# Patient Record
Sex: Female | Born: 1955 | Race: White | Hispanic: No | Marital: Married | Smoking: Never smoker
Health system: Southern US, Community
[De-identification: ages and names within clinical notes are randomized; demographics above are authoritative.]

## PROBLEM LIST (undated history)

## (undated) DIAGNOSIS — M549 Dorsalgia, unspecified: Secondary | ICD-10-CM

## (undated) DIAGNOSIS — E7800 Pure hypercholesterolemia, unspecified: Secondary | ICD-10-CM

## (undated) DIAGNOSIS — T7840XA Allergy, unspecified, initial encounter: Secondary | ICD-10-CM

## (undated) DIAGNOSIS — K219 Gastro-esophageal reflux disease without esophagitis: Secondary | ICD-10-CM

## (undated) HISTORY — PX: MOHS SURGERY: SUR867

## (undated) HISTORY — DX: Gastro-esophageal reflux disease without esophagitis: K21.9

## (undated) HISTORY — DX: Dorsalgia, unspecified: M54.9

## (undated) HISTORY — DX: Pure hypercholesterolemia, unspecified: E78.00

## (undated) HISTORY — DX: Allergy, unspecified, initial encounter: T78.40XA

## (undated) HISTORY — PX: FRACTURE SURGERY: SHX138

## (undated) HISTORY — PX: WISDOM TOOTH EXTRACTION: SHX21

---

## 1999-09-26 ENCOUNTER — Encounter: Payer: Self-pay | Admitting: Family Medicine

## 1999-09-26 ENCOUNTER — Encounter: Admission: RE | Admit: 1999-09-26 | Discharge: 1999-09-26 | Payer: Self-pay | Admitting: Family Medicine

## 1999-11-29 ENCOUNTER — Encounter: Payer: Self-pay | Admitting: Family Medicine

## 1999-11-29 ENCOUNTER — Encounter: Admission: RE | Admit: 1999-11-29 | Discharge: 1999-11-29 | Payer: Self-pay | Admitting: Family Medicine

## 2000-11-29 ENCOUNTER — Encounter: Payer: Self-pay | Admitting: Family Medicine

## 2000-11-29 ENCOUNTER — Encounter: Admission: RE | Admit: 2000-11-29 | Discharge: 2000-11-29 | Payer: Self-pay | Admitting: Family Medicine

## 2002-02-17 ENCOUNTER — Encounter: Payer: Self-pay | Admitting: Family Medicine

## 2002-02-17 ENCOUNTER — Encounter: Admission: RE | Admit: 2002-02-17 | Discharge: 2002-02-17 | Payer: Self-pay | Admitting: Family Medicine

## 2002-03-07 DIAGNOSIS — D126 Benign neoplasm of colon, unspecified: Secondary | ICD-10-CM

## 2002-03-07 HISTORY — DX: Benign neoplasm of colon, unspecified: D12.6

## 2002-11-07 ENCOUNTER — Emergency Department (HOSPITAL_COMMUNITY): Admission: EM | Admit: 2002-11-07 | Discharge: 2002-11-07 | Payer: Self-pay

## 2003-08-16 ENCOUNTER — Other Ambulatory Visit: Admission: RE | Admit: 2003-08-16 | Discharge: 2003-08-16 | Payer: Self-pay | Admitting: Family Medicine

## 2004-05-07 HISTORY — PX: VAGINAL HYSTERECTOMY: SUR661

## 2004-09-12 ENCOUNTER — Other Ambulatory Visit: Admission: RE | Admit: 2004-09-12 | Discharge: 2004-09-12 | Payer: Self-pay | Admitting: Obstetrics and Gynecology

## 2005-04-16 ENCOUNTER — Ambulatory Visit (HOSPITAL_COMMUNITY): Admission: RE | Admit: 2005-04-16 | Discharge: 2005-04-17 | Payer: Self-pay | Admitting: Obstetrics and Gynecology

## 2005-04-16 ENCOUNTER — Encounter (INDEPENDENT_AMBULATORY_CARE_PROVIDER_SITE_OTHER): Payer: Self-pay | Admitting: Specialist

## 2009-11-15 ENCOUNTER — Encounter (INDEPENDENT_AMBULATORY_CARE_PROVIDER_SITE_OTHER): Payer: Self-pay | Admitting: *Deleted

## 2009-11-18 ENCOUNTER — Encounter: Admission: RE | Admit: 2009-11-18 | Discharge: 2009-11-18 | Payer: Self-pay | Admitting: Family Medicine

## 2010-01-06 ENCOUNTER — Encounter (INDEPENDENT_AMBULATORY_CARE_PROVIDER_SITE_OTHER): Payer: Self-pay | Admitting: *Deleted

## 2010-01-10 ENCOUNTER — Ambulatory Visit: Payer: Self-pay | Admitting: Gastroenterology

## 2010-01-19 ENCOUNTER — Ambulatory Visit: Payer: Self-pay | Admitting: Gastroenterology

## 2010-01-20 ENCOUNTER — Encounter: Payer: Self-pay | Admitting: Gastroenterology

## 2010-02-10 ENCOUNTER — Telehealth (INDEPENDENT_AMBULATORY_CARE_PROVIDER_SITE_OTHER): Payer: Self-pay | Admitting: *Deleted

## 2010-06-08 NOTE — Procedures (Signed)
Summary: Colonoscopy  Patient: [name] Note: All result statuses are Final unless otherwise noted.  Tests: (1) Colonoscopy (COL)   COL Colonoscopy           DONE      [facility]     [address]     [place], [place]  [address]           COLONOSCOPY PROCEDURE REPORT     PATIENT:  [name], [name]  MR#:  [id number]     BIRTHDATE:  [birth year], 53 yrs. old  GENDER:  female     ENDOSCOPIST:  Judie Petit T. Russella Dar, MD, [facility]           PROCEDURE DATE:  01/19/2010     PROCEDURE:  Colonoscopy with snare polypectomy     ASA CLASS:  Class II     INDICATIONS:  1) surveillance and high-risk screening 2) family     history of colon cancer 3) follow-up of adenomatous polyp,     03/2002. Mother with colon cancer at 17.     MEDICATIONS:   Fentanyl 100 mcg IV, Versed 10 mg IV     DESCRIPTION OF PROCEDURE:   After the risks benefits and     alternatives of the procedure were thoroughly explained, informed     consent was obtained.  Digital rectal exam was performed and     revealed no abnormalities.   The LB [id number]-[id number] endoscope     was introduced through the anus and advanced to the cecum, which     was identified by both the appendix and ileocecal valve, without     limitations.  The quality of the prep was excellent, using     MoviPrep.  The instrument was then slowly withdrawn as the colon     was fully examined.     <<PROCEDUREIMAGES>>     FINDINGS:  A sessile polyp was found in the mid transverse colon.     It was 5 mm in size. Polyp was snared without cautery. Retrieval     was successful.  A normal appearing cecum, ileocecal valve, and     appendiceal orifice were identified. The ascending, hepatic     flexure, splenic flexure, descending, sigmoid colon, and rectum     appeared unremarkable. Retroflexed views in the rectum revealed no     abnormalities. The time to cecum =  3.5  minutes. The scope was     then withdrawn (time =  8.25  min) from the patient and the  procedure completed.           COMPLICATIONS:  None           ENDOSCOPIC IMPRESSION:     1) 5 mm sessile polyp in the mid transverse colon           RECOMMENDATIONS:     1) Await pathology results     2) Repeat Colonoscopy in 5 years.           Venita Lick. Russella Dar, MD, [facility]           CC: Merri Brunette, MD           n.     Rosalie DoctorVenita Lick. Rayshell Goecke at 01/19/2010 10:17 AM           Dhaliwal, Rinaldo Cloud, [id number]  Note: An exclamation mark (!) indicates a result that was not dispersed into the flowsheet. Document Creation Date: 01/19/2010 10:17 AM _______________________________________________________________________  (1) Order result status: Final  Collection or observation date-time: 01/19/2010 10:12 Requested date-time:  Receipt date-time:  Reported date-time:  Referring Physician:   Ordering Physician: Claudette Head [phone]) Specimen Source:  Source: Launa Grill Order Number: [id number] Lab site:   Appended Document: Colonoscopy     Procedures Next Due Date:    Colonoscopy: 01/2015

## 2010-06-08 NOTE — Progress Notes (Signed)
  Phone Note Other Incoming   Request: Send information Summary of Call: Request for records received from [facility]. Request forwarded to [employer].

## 2010-06-08 NOTE — Letter (Signed)
Summary: Previsit letter  [facility]  [address] [place], [place] [address]   Phone: [phone]  Fax: [phone]       11/15/2009 MRN: [id number]  [facility] [place] [address] [place], [place]  [address]  Dear [name],  Welcome to the Gastroenterology Division at [facility].    You are scheduled to see a nurse for your pre-procedure visit on 01-10-10 at 2:00p.m. on the 3rd floor at [facility], [address]  We ask that you try to arrive at our office 15 minutes prior to your appointment time to allow for check-in.  Your nurse visit will consist of discussing your medical and surgical history, your immediate family medical history, and your medications.    Please bring a complete list of all your medications or, if you prefer, bring the medication bottles and we will list them.  We will need to be aware of both prescribed and over the counter drugs.  We will need to know exact dosage information as well.  If you are on blood thinners (Coumadin, Plavix, Aggrenox, Ticlid, etc.) please call our office today/prior to your appointment, as we need to consult with your physician about holding your medication.   Please be prepared to read and sign documents such as consent forms, a financial agreement, and acknowledgement forms.  If necessary, and with your consent, a friend or relative is welcome to sit-in on the nurse visit with you.  Please bring your insurance card so that we may make a copy of it.  If your insurance requires a referral to see a specialist, please bring your referral form from your primary care physician.  No co-pay is required for this nurse visit.     If you cannot keep your appointment, please call [phone] to cancel or reschedule prior to your appointment date.  This allows [place] the opportunity to schedule an appointment for another patient in need of care.    Thank you for choosing [facility] for your medical  needs.  We appreciate the opportunity to care for you.  Please visit [place] at our website  to learn more about our practice.                     Sincerely.                                                                                                                   The Gastroenterology Division

## 2010-06-08 NOTE — Letter (Signed)
Summary: Patient Notice-Hyperplastic Polyps  [facility]  [address] [place], [place] [address]   Phone: [phone]  Fax: [phone]        January 20, 2010 MRN: [id number]    [name] [address] [place], [place]  [address]    Dear [name],  I am pleased to inform you that the colon polyp(s) removed during your recent colonoscopy was (were) found to be hyperplastic. These types of polyps are NOT pre-cancerous.  It is therefore my recommendation that you have a repeat colonoscopy examination in 5 years for routine colorectal cancer screening.  Should you develop new or worsening symptoms of abdominal pain, bowel habit changes or bleeding from the rectum or bowels, please schedule an evaluation with either your primary care physician or with me.  Continue treatment plan as outlined the day of your exam.  Please call us if you are having persistent problems or have questions about your condition that have not been fully answered at this time.  Sincerely,  Meryl Dare MD [facility]  This letter has been electronically signed by your physician.  Appended Document: Patient Notice-Hyperplastic Polyps letter mailed

## 2010-06-08 NOTE — Miscellaneous (Signed)
Summary: [facility] Previsit/prep  Clinical Lists Changes  Medications: Added new medication of MOVIPREP 100 GM  SOLR (PEG-KCL-NACL-NASULF-NA ASC-C) As per prep instructions. - Signed Rx of MOVIPREP 100 GM  SOLR (PEG-KCL-NACL-NASULF-NA ASC-C) As per prep instructions.;  #1 x 0;  Signed;  Entered by: Wyona Almas RN;  Authorized by: Meryl Dare MD [facility];  Method used: Electronically to [id number]*, [address] [place], [place], [place]  [address], Ph: [phone], Fax: [phone] Observations: Added new observation of NKA: T (01/10/2010 13:59)    Prescriptions: MOVIPREP 100 GM  SOLR (PEG-KCL-NACL-NASULF-NA ASC-C) As per prep instructions.  #1 x 0   Entered by:   Wyona Almas RN   Authorized by:   Meryl Dare MD [facility]   Signed by:   Wyona Almas RN on 01/10/2010   Method used:   Electronically to        [facility] [phone]* (retail)       [address]       [place], [place]  [address]       Ph: [phone]       Fax: [phone]   RxID:   [id number]

## 2010-06-08 NOTE — Letter (Signed)
Summary: [facility] Instructions  [facility]  [address] [place], [place] [address]   Phone: [phone]  Fax: [phone]       [name]    February 01, 1956    MRN: [id number]        Procedure Day /Date: Thursday  01-19-10     Arrival Time: 8:30 a.m.     Procedure Time: 9:30 a.m.     Location of Procedure:                    _x _  [facility] (4th Floor)                       PREPARATION FOR COLONOSCOPY WITH MOVIPREP   Starting 5 days prior to your procedure  01-14-10 do not eat nuts, seeds, popcorn, corn, beans, peas,  salads, or any raw vegetables.  Do not take any fiber supplements (e.g. Metamucil, Citrucel, and Benefiber).  THE DAY BEFORE YOUR PROCEDURE         DATE:  01-18-10  DAY: Wednesday  1.  Drink clear liquids the entire day-NO SOLID FOOD  2.  Do not drink anything colored red or purple.  Avoid juices with pulp.  No orange juice.  3.  Drink at least 64 oz. (8 glasses) of fluid/clear liquids during the day to prevent dehydration and help the prep work efficiently.  CLEAR LIQUIDS INCLUDE: Water Jello Ice Popsicles Tea (sugar ok, no milk/cream) Powdered fruit flavored drinks Coffee (sugar ok, no milk/cream) Gatorade Juice: apple, white grape, white cranberry  Lemonade Clear bullion, consomm, broth Carbonated beverages (any kind) Strained chicken noodle soup Hard Candy                             4.  In the morning, mix first dose of MoviPrep solution:    Empty 1 Pouch A and 1 Pouch B into the disposable container    Add lukewarm drinking water to the top line of the container. Mix to dissolve    Refrigerate (mixed solution should be used within 24 hrs)  5.  Begin drinking the prep at 5:00 p.m. The MoviPrep container is divided by 4 marks.   Every 15 minutes drink the solution down to the next mark (approximately 8 oz) until the full liter is complete.   6.  Follow completed prep with 16 oz of clear liquid of your choice  (Nothing red or purple).  Continue to drink clear liquids until bedtime.  7.  Before going to bed, mix second dose of MoviPrep solution:    Empty 1 Pouch A and 1 Pouch B into the disposable container    Add lukewarm drinking water to the top line of the container. Mix to dissolve    Refrigerate  THE DAY OF YOUR PROCEDURE      DATE: 01-19-10  DAY: Thursday  Beginning at  4:30 a.m. (5 hours before procedure):         1. Every 15 minutes, drink the solution down to the next mark (approx 8 oz) until the full liter is complete.  2. Follow completed prep with 16 oz. of clear liquid of your choice.    3. You may drink clear liquids until  7:30 a.m. (2 HOURS BEFORE PROCEDURE).   MEDICATION INSTRUCTIONS  Unless otherwise instructed, you should take regular prescription medications with a small sip of water   as early  as possible the morning of your procedure.        OTHER INSTRUCTIONS  You will need a responsible adult at least 56 years of age to accompany you and drive you home.   This person must remain in the waiting room during your procedure.  Wear loose fitting clothing that is easily removed.  Leave jewelry and other valuables at home.  However, you may wish to bring a book to read or  an iPod/MP3 player to listen to music as you wait for your procedure to start.  Remove all body piercing jewelry and leave at home.  Total time from sign-in until discharge is approximately 2-3 hours.  You should go home directly after your procedure and rest.  You can resume normal activities the  day after your procedure.  The day of your procedure you should not:   Drive   Make legal decisions   Operate machinery   Drink alcohol   Return to work  You will receive specific instructions about eating, activities and medications before you leave.    The above instructions have been reviewed and explained to me by   Wyona Almas RN  January 10, 2010 2:24 PM     I fully  understand and can verbalize these instructions _____________________________ Date _________

## 2010-09-22 NOTE — Op Note (Signed)
[name], [name]              ACCOUNT NO.:  [id number]   MEDICAL RECORD NO.:  [id number]          PATIENT TYPE:  INP   LOCATION:  0008                         FACILITY:  [facility]   PHYSICIAN:  Cynthia P. Romine, M.D.DATE OF BIRTH:  1955-10-19   DATE OF PROCEDURE:  04/16/2005  DATE OF DISCHARGE:                                 OPERATIVE REPORT   PREOPERATIVE DIAGNOSES:  Menorrhagia, submucous fibroid, urinary  incontinence.   POSTOPERATIVE DIAGNOSES:  Menorrhagia, submucous fibroid, urinary  incontinence. Path pending.   PROCEDURE:  Total vaginal hysterectomy, pubovaginal sling by Dr. Logan Bores which  will be dictated separately.   SURGEON:  Cynthia P. Romine, M.D.   ASSISTANT:  Hal Morales, M.D.   ANESTHESIA:  General endotracheal.   ESTIMATED BLOOD LOSS:  100 mL.   COMPLICATIONS:  None.   DESCRIPTION OF PROCEDURE:  The patient was taken to the operating room and  was placed in the dorsal lithotomy position while she was still awake  because of her history of having had a lumbar laminectomy and the surgeons  desired to be certain that the patient was comfortable with her feet up in  stirrups before she was put under anesthesia. She was then put under general  endotracheal anesthesia and prepped and draped in the usual fashion. A  posterior weighted and anterior Deaver retractor were placed. The cervix was  grasped with a Christella Hartigan tenaculum. The mucosa over the cervix was infiltrated  with 0.5% Marcaine with epinephrine. The mucosa was then incised with a  knife and pushed back bluntly with a sponge. Attention was next turned  posteriorly. A posterior colpotomy incision was made and the Bonnano  retractor was placed. The LigaSure device was used to clamp and cauterize  the uterosacral ligaments on each side. It was then cut, the hysterectomy  continued at the cardinal ligament clamping, cauterizing and cutting in  sequence. The uterine arteries were taken with the  LigaSure as well. The  hysterectomy then continued up the broad ligament clamping, cauterizing and  cutting until such a time that the fundus could then be delivered through  the posterior cul-de-sac. A regular Heaney clamp was placed across the  pedicle containing the utero-ovarian round and tube on each side and the  specimen was removed with Mayo scissors and sent to pathology. The pedicles  were doubly tied first with a free tie of #0 Vicryl and then with a Heaney  suture. On each side, there was a small area between this pedicle and the  previous pedicle that was noted to be bleeding slightly but it was  controlled with a single suture of #0 Vicryl. All the pedicles were  inspected and were felt to be hemostatic. The ovaries and tubes appeared  normal. The pedicles were then cut free and the ovaries were allowed to  retract back up into the abdomen. The vaginal cuff was then run and locked  with #0 chromic from 1 o'clock to 11 o'clock for hemostasis. Anesthesia was  used to bring the uterosacral ligaments together in the midline by going in  from the vagina into the  peritoneum posteriorly grasping the left  uterosacral ligament skimming across the peritoneum, grasping the right  uterosacral ligament and coming out again from the peritoneum out through  the vagina in the midline. The suture was tied and it did seem to bring the  uterosacral ligaments together nicely. The vaginal cuff was  then closed with three interrupted figure-of-eight sutures of #0 Vicryl. The  incision was irrigated, was hemostatic and the procedure was then turned  over to Dr. Eudelia Bunch who was in the room to do the urologic procedure. This  will be dictated separately.           ______________________________  Edwena Felty. Romine, M.D.     CPR/MEDQ  D:  04/16/2005  T:  04/17/2005  Job:  [id number]   cc:   Hal Morales, M.D.  Fax: [phone]

## 2010-09-22 NOTE — Op Note (Signed)
NAMEWILEY, FLICKER              ACCOUNT NO.:  [id number]   MEDICAL RECORD NO.:  [id number]          PATIENT TYPE:  INP   LOCATION:  0008                         FACILITY[facility]   PHYSICIAN:  Jamison Neighbor, M.D.  DATE OF BIRTH:  1956/03/03   DATE OF PROCEDURE:  04/16/2005  DATE OF DISCHARGE:                                 OPERATIVE REPORT   PREOPERATIVE DIAGNOSIS:  Stress urinary incontinence.   POSTOPERATIVE DIAGNOSIS:  Stress urinary incontinence.   PROCEDURE:  Cystoscopy and transobturator sling.   SURGEON:  Jamison Neighbor, M.D.   ANESTHESIA:  General.   COMPLICATIONS:  None.   DRAINS:  A 16 French Foley catheter.   BRIEF HISTORY:  This 55 year old female was scheduled to undergo  hysterectomy because of menometrorrhagia and a systematic cystocele.  She is  known to have a submucosus myoma.  She will also undergo a pubovaginal  sling.  She is undergoing appropriate evaluation, including a Marshall test  and urodynamics, which demonstrated the presence of stress incontinence but  no evidence of any urgency problems.  She will undergo placement of the  sling after the completion of her hysterectomy.  She understands the risks  and benefits of the procedure and gave full informed consent.   PREOPERATIVE DIAGNOSIS:  Interstitial cystitis.   POSTOPERATIVE DIAGNOSIS:  Interstitial cystitis.   PROCEDURE:  Cystoscopy, urethral calibration, hydrodistention of the  bladder, Marcaine and Pyridium instillation, Marcaine and Kenalog injection.   SURGEON:  Jamison Neighbor, M.D.   ANESTHESIA:  General.   COMPLICATIONS:  None.   DRAINS:  None.   PROCEDURE:  After the successful induction of general anesthesia, the  patient is placed in a dorsal lithotomy position, prepped with Betadine, and  draped in the usual sterile fashion.  Patient underwent a successful  hysterectomy by Dr. Tresa Res, who left the vaginal cuff  completely closed.  Patient was taken out of the high  lithotomy position and placed in a low  lithotomy position.  The anterior vaginal mucosa was infiltrated with local  anesthetic, and an incision was made directly over the mid urethra.  Flaps  were raised bilaterally, extending back to but not through the endopelvic  fascia .  A stab incision was made in the groin crease at the approximate  level of the clitoris, and the curved needle from the [facility]  sling kit was passed from the groin incision to the vaginal incision  bilaterally.  On the right-hand side, the mucosa at the sulcus of the vagina  was caught by the needle, which was then repositioned, and that small  opening was then closed with a figure-of-eight suture of 2-0 Vicryl.  The  cystoscope was inserted.  The bladder was carefully inspected with a 70  degree lenses.  There was no evidence of injury to the bladder.  The bladder  itself was unremarkable in its appearance with no tumors or stones seen.  The sling was then placed, and the needles were withdrawn, pushing this up  to the two groin incisions.  The sling tension was then set by cutting away  the blue tab and then pulling away the protective sheath by keeping a small  instrument between the urethra and the sling.  The tension was ascertained  to be correct by careful inspection, which shows good coaptation of the  urethra and by Crede maneuver, which did show normal flow of urine but no  flow of urine until Crede maneuver was performed.  The area was carefully  irrigated with antibiotic solution.  It did appear there was a small loop  between the urethra and the sling, as planned.  The incision was closed with  a running suture of 2-0 Vicryl.  The patient had a vaginal pack placed.  She  tolerated the procedure well and was taken to the recovery room in good  condition.           ______________________________  Jamison Neighbor, M.D.  Electronically Signed     RJE/MEDQ  D:  04/16/2005  T:  04/16/2005   Job:  [id number]

## 2012-03-26 ENCOUNTER — Other Ambulatory Visit: Payer: Self-pay | Admitting: Internal Medicine

## 2012-03-26 DIAGNOSIS — Z1231 Encounter for screening mammogram for malignant neoplasm of breast: Secondary | ICD-10-CM

## 2012-05-14 ENCOUNTER — Ambulatory Visit: Payer: Self-pay

## 2012-06-10 ENCOUNTER — Ambulatory Visit
Admission: RE | Admit: 2012-06-10 | Discharge: 2012-06-10 | Disposition: A | Discharge status: Home / Self Care | Payer: BC Managed Care – PPO | Source: Ambulatory Visit | Attending: Internal Medicine | Admitting: Internal Medicine

## 2012-06-10 DIAGNOSIS — Z1231 Encounter for screening mammogram for malignant neoplasm of breast: Secondary | ICD-10-CM

## 2013-02-19 ENCOUNTER — Other Ambulatory Visit: Payer: Self-pay | Admitting: Dermatology

## 2013-09-10 ENCOUNTER — Ambulatory Visit: Payer: Self-pay | Admitting: Otolaryngology

## 2013-09-11 LAB — PATHOLOGY REPORT

## 2014-05-07 HISTORY — PX: COLONOSCOPY: SHX174

## 2014-08-28 NOTE — Op Note (Signed)
PATIENT NAME:  [name], [name] MR#:  [id number] DATE OF BIRTH:  1955/10/16  DATE OF PROCEDURE:  09/10/2013  PREOPERATIVE DIAGNOSIS: Left forehead lesion (periosteal fibroma versus osteoma).   POSTOPERATIVE DIAGNOSIS:  Periosteal fibrolipoma.  SURGEON: Nadeen Landau, M.D.   DESCRIPTION OF PROCEDURE: The patient was placed in the supine position on the operating table, and after LMA anesthesia had been induced, the patient's forehead was marked, locally anesthetized, prepped and draped in the usual fashion. A #15 C blade was used to make an incision through the skin. The skin was reflected in a subfrontalis plane. The lesion was skeletonized. Sharp dissection was carried out to remove the lesion from the frontal skull. The lesion was completely freed and removed sharply. The wound was then copiously irrigated and closed with 5-0 Vicryl and 7-0 nylon. Iced gauze was placed after bacitracin. The patient was then returned to anesthesia, allowed to emerge from anesthesia in the operating room, and taken to the recovery room in stable condition. There were no complications. Estimated blood loss 5 mL.    ____________________________ J. Nadeen Landau, MD jmc:dmm D: 09/10/2013 08:09:43 ET T: 09/10/2013 12:29:12 ET JOB#: [id number]  cc: Janalee Dane, MD, <Dictator> Nicholos Johns MD ELECTRONICALLY SIGNED 10/04/2013 17:24

## 2014-09-13 ENCOUNTER — Encounter: Payer: Self-pay | Admitting: Gastroenterology

## 2014-12-10 ENCOUNTER — Encounter: Payer: Self-pay | Admitting: Gastroenterology

## 2014-12-27 ENCOUNTER — Encounter: Payer: Self-pay | Admitting: Gastroenterology

## 2015-01-11 ENCOUNTER — Other Ambulatory Visit: Payer: Self-pay

## 2015-01-11 DIAGNOSIS — Z1231 Encounter for screening mammogram for malignant neoplasm of breast: Secondary | ICD-10-CM

## 2015-02-03 ENCOUNTER — Ambulatory Visit
Admission: RE | Admit: 2015-02-03 | Discharge: 2015-02-03 | Disposition: A | Discharge status: Home / Self Care | Payer: BLUE CROSS/BLUE SHIELD | Source: Ambulatory Visit

## 2015-02-03 DIAGNOSIS — Z1231 Encounter for screening mammogram for malignant neoplasm of breast: Secondary | ICD-10-CM

## 2015-02-22 ENCOUNTER — Ambulatory Visit (AMBULATORY_SURGERY_CENTER): Payer: Self-pay

## 2015-02-22 VITALS — Ht 67.0 in | Wt 214.6 lb

## 2015-02-22 DIAGNOSIS — Z860100 Personal history of colon polyps, unspecified: Secondary | ICD-10-CM

## 2015-02-22 MED ORDER — SUPREP BOWEL PREP KIT 17.5-3.13-1.6 GM/177ML PO SOLN: 1.0000 | Freq: Once | ORAL | Status: DC

## 2015-02-22 NOTE — Progress Notes (Signed)
No allergies to eggs or soy No home oxygen No diet/weight loss meds No past problems with anesthesia  Refused emmi; has had before

## 2015-03-08 ENCOUNTER — Encounter: Payer: Self-pay | Admitting: Gastroenterology

## 2015-03-08 ENCOUNTER — Ambulatory Visit (AMBULATORY_SURGERY_CENTER): Payer: BLUE CROSS/BLUE SHIELD | Admitting: Gastroenterology

## 2015-03-08 VITALS — BP 127/68 | HR 58 | Temp 96.3°F | Resp 16 | Ht 67.0 in | Wt 214.0 lb

## 2015-03-08 DIAGNOSIS — D123 Benign neoplasm of transverse colon: Secondary | ICD-10-CM

## 2015-03-08 DIAGNOSIS — D122 Benign neoplasm of ascending colon: Secondary | ICD-10-CM

## 2015-03-08 DIAGNOSIS — Z86010 Personal history of colonic polyps: Secondary | ICD-10-CM

## 2015-03-08 DIAGNOSIS — Z800 Family history of malignant neoplasm of digestive organs: Secondary | ICD-10-CM

## 2015-03-08 MED ORDER — SODIUM CHLORIDE 0.9 % IV SOLN: 500.0000 mL | INTRAVENOUS | Status: DC

## 2015-03-08 NOTE — Progress Notes (Signed)
Called to room to assist during endoscopic procedure.  Patient ID and intended procedure confirmed with present staff. Received instructions for my participation in the procedure from the performing physician.  

## 2015-03-08 NOTE — Op Note (Signed)
[name], [address]   COLONOSCOPY PROCEDURE REPORT  PATIENT: Jane, Cook  MR#: [id number] BIRTHDATE: 1956/05/05 , 68  yrs. old GENDER: female ENDOSCOPIST: Ladene Artist, MD, [facility] PROCEDURE DATE:  03/08/2015 PROCEDURE:   Colonoscopy, surveillance and Colonoscopy with snare polypectomy First Screening Colonoscopy - Avg.  risk and is 50 yrs.  old or older - No.  Prior Negative Screening - Now for repeat screening. N/A  History of Adenoma - Now for follow-up colonoscopy & has been > or = to 3 yrs.  Yes hx of adenoma.  Has been 3 or more years since last colonoscopy.  Polyps removed today? Yes ASA CLASS:   Class II INDICATIONS:Surveillance due to prior colonic neoplasia, PH Colon Adenoma, and FH Colon or Rectal Adenocarcinoma. MEDICATIONS: Monitored anesthesia care and Propofol 300 mg IV DESCRIPTION OF PROCEDURE:   After the risks benefits and alternatives of the procedure were thoroughly explained, informed consent was obtained.  The digital rectal exam revealed no abnormalities of the rectum.   The LB PFC-H190 [id number]  endoscope was introduced through the anus and advanced to the cecum, which was identified by both the appendix and ileocecal valve. No adverse events experienced.   The quality of the prep was excellent. (Suprep was used)  The instrument was then slowly withdrawn as the colon was fully examined. Estimated blood loss is zero unless otherwise noted in this procedure report.    COLON FINDINGS: Two sessile polyps measuring 5 mm in size were found in the transverse colon and ascending colon.  Polypectomies were performed with a cold snare.  The resection was complete, the polyp tissue was completely retrieved and sent to histology.   The examination was otherwise normal.  Retroflexed views revealed no abnormalities. The time to cecum = 3.3 Withdrawal time = 11.3   The scope was withdrawn and the procedure  completed. COMPLICATIONS: There were no immediate complications.  ENDOSCOPIC IMPRESSION: 1.   Two sessile polyps in the transverse colon and ascending colon; polypectomies performed with a cold snare 2.   The examination was otherwise normal  RECOMMENDATIONS: 1.  Await pathology results 2.  Repeat Colonoscopy in 5 years.  eSigned:  Ladene Artist, MD, [facility] 03/08/2015 9:35 AM   cc: Burnard Bunting, MD

## 2015-03-08 NOTE — Patient Instructions (Signed)
YOU HAD AN ENDOSCOPIC PROCEDURE TODAY AT THE [facility]:   Refer to the procedure report that was given to you for any specific questions about what was found during the examination.  If the procedure report does not answer your questions, please call your gastroenterologist to clarify.  If you requested that your care partner not be given the details of your procedure findings, then the procedure report has been included in a sealed envelope for you to review at your convenience later.  YOU SHOULD EXPECT: Some feelings of bloating in the abdomen. Passage of more gas than usual.  Walking can help get rid of the air that was put into your GI tract during the procedure and reduce the bloating. If you had a lower endoscopy (such as a colonoscopy or flexible sigmoidoscopy) you may notice spotting of blood in your stool or on the toilet paper. If you underwent a bowel prep for your procedure, you may not have a normal bowel movement for a few days.  Please Note:  You might notice some irritation and congestion in your nose or some drainage.  This is from the oxygen used during your procedure.  There is no need for concern and it should clear up in a day or so.  SYMPTOMS TO REPORT IMMEDIATELY:   Following lower endoscopy (colonoscopy or flexible sigmoidoscopy):  Excessive amounts of blood in the stool  Significant tenderness or worsening of abdominal pains  Swelling of the abdomen that is new, acute  Fever of 100F or higher   For urgent or emergent issues, a gastroenterologist can be reached at any hour by calling [phone].   DIET: Your first meal following the procedure should be a small meal and then it is ok to progress to your normal diet. Heavy or fried foods are harder to digest and may make you feel nauseous or bloated.  Likewise, meals heavy in dairy and vegetables can increase bloating.  Drink plenty of fluids but you should avoid alcoholic beverages for 24  hours.  ACTIVITY:  You should plan to take it easy for the rest of today and you should NOT DRIVE or use heavy machinery until tomorrow (because of the sedation medicines used during the test).    FOLLOW UP: Our staff will call the number listed on your records the next business day following your procedure to check on you and address any questions or concerns that you may have regarding the information given to you following your procedure. If we do not reach you, we will leave a message.  However, if you are feeling well and you are not experiencing any problems, there is no need to return our call.  We will assume that you have returned to your regular daily activities without incident.  If any biopsies were taken you will be contacted by phone or by letter within the next 1-3 weeks.  Please call us at [phone] if you have not heard about the biopsies in 3 weeks.    SIGNATURES/CONFIDENTIALITY: You and/or your care partner have signed paperwork which will be entered into your electronic medical record.  These signatures attest to the fact that that the information above on your After Visit Summary has been reviewed and is understood.  Full responsibility of the confidentiality of this discharge information lies with you and/or your care-partner. 

## 2015-03-09 ENCOUNTER — Telehealth: Payer: Self-pay

## 2015-03-09 NOTE — Telephone Encounter (Signed)
No answer, left message

## 2015-03-14 ENCOUNTER — Encounter: Payer: Self-pay | Admitting: Gastroenterology

## 2016-01-02 ENCOUNTER — Other Ambulatory Visit: Payer: Self-pay | Admitting: Internal Medicine

## 2016-01-02 DIAGNOSIS — Z1231 Encounter for screening mammogram for malignant neoplasm of breast: Secondary | ICD-10-CM

## 2016-01-22 DIAGNOSIS — R0602 Shortness of breath: Secondary | ICD-10-CM | POA: Diagnosis not present

## 2016-01-22 DIAGNOSIS — J22 Unspecified acute lower respiratory infection: Secondary | ICD-10-CM | POA: Diagnosis not present

## 2016-01-22 DIAGNOSIS — Z889 Allergy status to unspecified drugs, medicaments and biological substances status: Secondary | ICD-10-CM | POA: Diagnosis not present

## 2016-01-25 DIAGNOSIS — J209 Acute bronchitis, unspecified: Secondary | ICD-10-CM | POA: Diagnosis not present

## 2016-01-25 DIAGNOSIS — R05 Cough: Secondary | ICD-10-CM | POA: Diagnosis not present

## 2016-01-25 DIAGNOSIS — J302 Other seasonal allergic rhinitis: Secondary | ICD-10-CM | POA: Diagnosis not present

## 2016-01-25 DIAGNOSIS — Z0000 Encounter for general adult medical examination without abnormal findings: Secondary | ICD-10-CM | POA: Diagnosis not present

## 2016-01-25 DIAGNOSIS — K219 Gastro-esophageal reflux disease without esophagitis: Secondary | ICD-10-CM | POA: Diagnosis not present

## 2016-01-25 DIAGNOSIS — E784 Other hyperlipidemia: Secondary | ICD-10-CM | POA: Diagnosis not present

## 2016-02-01 DIAGNOSIS — F329 Major depressive disorder, single episode, unspecified: Secondary | ICD-10-CM | POA: Diagnosis not present

## 2016-02-01 DIAGNOSIS — Z23 Encounter for immunization: Secondary | ICD-10-CM | POA: Diagnosis not present

## 2016-02-01 DIAGNOSIS — Z0000 Encounter for general adult medical examination without abnormal findings: Secondary | ICD-10-CM | POA: Diagnosis not present

## 2016-02-01 DIAGNOSIS — E784 Other hyperlipidemia: Secondary | ICD-10-CM | POA: Diagnosis not present

## 2016-02-01 DIAGNOSIS — Z1389 Encounter for screening for other disorder: Secondary | ICD-10-CM | POA: Diagnosis not present

## 2016-02-02 DIAGNOSIS — H52203 Unspecified astigmatism, bilateral: Secondary | ICD-10-CM | POA: Diagnosis not present

## 2016-02-02 DIAGNOSIS — H18602 Keratoconus, unspecified, left eye: Secondary | ICD-10-CM | POA: Diagnosis not present

## 2016-02-02 DIAGNOSIS — H04123 Dry eye syndrome of bilateral lacrimal glands: Secondary | ICD-10-CM | POA: Diagnosis not present

## 2016-02-02 DIAGNOSIS — H1789 Other corneal scars and opacities: Secondary | ICD-10-CM | POA: Diagnosis not present

## 2016-02-03 DIAGNOSIS — F329 Major depressive disorder, single episode, unspecified: Secondary | ICD-10-CM | POA: Diagnosis not present

## 2016-02-06 ENCOUNTER — Ambulatory Visit
Admission: RE | Admit: 2016-02-06 | Discharge: 2016-02-06 | Disposition: A | Discharge status: Home / Self Care | Payer: BLUE CROSS/BLUE SHIELD | Source: Ambulatory Visit | Attending: Internal Medicine | Admitting: Internal Medicine

## 2016-02-06 DIAGNOSIS — Z1231 Encounter for screening mammogram for malignant neoplasm of breast: Secondary | ICD-10-CM | POA: Diagnosis not present

## 2016-02-15 DIAGNOSIS — F329 Major depressive disorder, single episode, unspecified: Secondary | ICD-10-CM | POA: Diagnosis not present

## 2016-02-29 DIAGNOSIS — F329 Major depressive disorder, single episode, unspecified: Secondary | ICD-10-CM | POA: Diagnosis not present

## 2016-03-13 DIAGNOSIS — J3089 Other allergic rhinitis: Secondary | ICD-10-CM | POA: Diagnosis not present

## 2016-03-14 DIAGNOSIS — F329 Major depressive disorder, single episode, unspecified: Secondary | ICD-10-CM | POA: Diagnosis not present

## 2016-03-16 DIAGNOSIS — J3089 Other allergic rhinitis: Secondary | ICD-10-CM | POA: Diagnosis not present

## 2016-03-26 DIAGNOSIS — J3089 Other allergic rhinitis: Secondary | ICD-10-CM | POA: Diagnosis not present

## 2016-03-28 DIAGNOSIS — J3089 Other allergic rhinitis: Secondary | ICD-10-CM | POA: Diagnosis not present

## 2016-04-02 DIAGNOSIS — J3089 Other allergic rhinitis: Secondary | ICD-10-CM | POA: Diagnosis not present

## 2016-04-05 DIAGNOSIS — J3089 Other allergic rhinitis: Secondary | ICD-10-CM | POA: Diagnosis not present

## 2016-04-09 DIAGNOSIS — J3089 Other allergic rhinitis: Secondary | ICD-10-CM | POA: Diagnosis not present

## 2016-04-11 DIAGNOSIS — J3089 Other allergic rhinitis: Secondary | ICD-10-CM | POA: Diagnosis not present

## 2016-04-17 DIAGNOSIS — J3089 Other allergic rhinitis: Secondary | ICD-10-CM | POA: Diagnosis not present

## 2016-04-19 DIAGNOSIS — J3089 Other allergic rhinitis: Secondary | ICD-10-CM | POA: Diagnosis not present

## 2016-04-23 DIAGNOSIS — J3089 Other allergic rhinitis: Secondary | ICD-10-CM | POA: Diagnosis not present

## 2016-04-25 DIAGNOSIS — J3089 Other allergic rhinitis: Secondary | ICD-10-CM | POA: Diagnosis not present

## 2016-05-02 DIAGNOSIS — J3089 Other allergic rhinitis: Secondary | ICD-10-CM | POA: Diagnosis not present

## 2016-05-04 DIAGNOSIS — J3089 Other allergic rhinitis: Secondary | ICD-10-CM | POA: Diagnosis not present

## 2016-05-08 DIAGNOSIS — J3089 Other allergic rhinitis: Secondary | ICD-10-CM | POA: Diagnosis not present

## 2016-05-10 DIAGNOSIS — J3089 Other allergic rhinitis: Secondary | ICD-10-CM | POA: Diagnosis not present

## 2016-05-14 DIAGNOSIS — R05 Cough: Secondary | ICD-10-CM | POA: Diagnosis not present

## 2016-05-14 DIAGNOSIS — E669 Obesity, unspecified: Secondary | ICD-10-CM | POA: Diagnosis not present

## 2016-05-14 DIAGNOSIS — K219 Gastro-esophageal reflux disease without esophagitis: Secondary | ICD-10-CM | POA: Diagnosis not present

## 2016-05-14 DIAGNOSIS — J302 Other seasonal allergic rhinitis: Secondary | ICD-10-CM | POA: Diagnosis not present

## 2016-05-15 DIAGNOSIS — J3089 Other allergic rhinitis: Secondary | ICD-10-CM | POA: Diagnosis not present

## 2016-05-21 DIAGNOSIS — J3089 Other allergic rhinitis: Secondary | ICD-10-CM | POA: Diagnosis not present

## 2016-05-25 DIAGNOSIS — J3089 Other allergic rhinitis: Secondary | ICD-10-CM | POA: Diagnosis not present

## 2016-05-28 DIAGNOSIS — J3089 Other allergic rhinitis: Secondary | ICD-10-CM | POA: Diagnosis not present

## 2016-05-31 DIAGNOSIS — J3089 Other allergic rhinitis: Secondary | ICD-10-CM | POA: Diagnosis not present

## 2016-06-04 DIAGNOSIS — J3089 Other allergic rhinitis: Secondary | ICD-10-CM | POA: Diagnosis not present

## 2016-06-08 DIAGNOSIS — J3089 Other allergic rhinitis: Secondary | ICD-10-CM | POA: Diagnosis not present

## 2016-06-12 DIAGNOSIS — J3089 Other allergic rhinitis: Secondary | ICD-10-CM | POA: Diagnosis not present

## 2016-06-21 DIAGNOSIS — J3089 Other allergic rhinitis: Secondary | ICD-10-CM | POA: Diagnosis not present

## 2016-06-26 DIAGNOSIS — J3089 Other allergic rhinitis: Secondary | ICD-10-CM | POA: Diagnosis not present

## 2016-06-28 DIAGNOSIS — J3089 Other allergic rhinitis: Secondary | ICD-10-CM | POA: Diagnosis not present

## 2016-07-02 DIAGNOSIS — J3089 Other allergic rhinitis: Secondary | ICD-10-CM | POA: Diagnosis not present

## 2016-07-10 DIAGNOSIS — J3089 Other allergic rhinitis: Secondary | ICD-10-CM | POA: Diagnosis not present

## 2016-07-18 DIAGNOSIS — J3089 Other allergic rhinitis: Secondary | ICD-10-CM | POA: Diagnosis not present

## 2016-07-27 DIAGNOSIS — J3089 Other allergic rhinitis: Secondary | ICD-10-CM | POA: Diagnosis not present

## 2016-08-01 DIAGNOSIS — J3089 Other allergic rhinitis: Secondary | ICD-10-CM | POA: Diagnosis not present

## 2016-08-06 DIAGNOSIS — J3089 Other allergic rhinitis: Secondary | ICD-10-CM | POA: Diagnosis not present

## 2016-08-10 DIAGNOSIS — J3089 Other allergic rhinitis: Secondary | ICD-10-CM | POA: Diagnosis not present

## 2016-08-13 DIAGNOSIS — L821 Other seborrheic keratosis: Secondary | ICD-10-CM | POA: Diagnosis not present

## 2016-08-13 DIAGNOSIS — J3089 Other allergic rhinitis: Secondary | ICD-10-CM | POA: Diagnosis not present

## 2016-08-16 DIAGNOSIS — J3089 Other allergic rhinitis: Secondary | ICD-10-CM | POA: Diagnosis not present

## 2016-08-20 DIAGNOSIS — H2513 Age-related nuclear cataract, bilateral: Secondary | ICD-10-CM | POA: Diagnosis not present

## 2016-08-20 DIAGNOSIS — H1789 Other corneal scars and opacities: Secondary | ICD-10-CM | POA: Diagnosis not present

## 2016-08-20 DIAGNOSIS — J3089 Other allergic rhinitis: Secondary | ICD-10-CM | POA: Diagnosis not present

## 2016-08-20 DIAGNOSIS — H16102 Unspecified superficial keratitis, left eye: Secondary | ICD-10-CM | POA: Diagnosis not present

## 2016-08-20 DIAGNOSIS — H18603 Keratoconus, unspecified, bilateral: Secondary | ICD-10-CM | POA: Diagnosis not present

## 2016-08-22 DIAGNOSIS — E784 Other hyperlipidemia: Secondary | ICD-10-CM | POA: Diagnosis not present

## 2016-08-22 DIAGNOSIS — K219 Gastro-esophageal reflux disease without esophagitis: Secondary | ICD-10-CM | POA: Diagnosis not present

## 2016-08-22 DIAGNOSIS — J302 Other seasonal allergic rhinitis: Secondary | ICD-10-CM | POA: Diagnosis not present

## 2016-08-22 DIAGNOSIS — Z1389 Encounter for screening for other disorder: Secondary | ICD-10-CM | POA: Diagnosis not present

## 2016-08-22 DIAGNOSIS — F329 Major depressive disorder, single episode, unspecified: Secondary | ICD-10-CM | POA: Diagnosis not present

## 2016-08-28 DIAGNOSIS — J3089 Other allergic rhinitis: Secondary | ICD-10-CM | POA: Diagnosis not present

## 2016-09-03 DIAGNOSIS — J3089 Other allergic rhinitis: Secondary | ICD-10-CM | POA: Diagnosis not present

## 2016-09-12 DIAGNOSIS — J3089 Other allergic rhinitis: Secondary | ICD-10-CM | POA: Diagnosis not present

## 2016-09-18 DIAGNOSIS — J3089 Other allergic rhinitis: Secondary | ICD-10-CM | POA: Diagnosis not present

## 2016-09-24 DIAGNOSIS — J3089 Other allergic rhinitis: Secondary | ICD-10-CM | POA: Diagnosis not present

## 2016-10-04 DIAGNOSIS — J3089 Other allergic rhinitis: Secondary | ICD-10-CM | POA: Diagnosis not present

## 2016-10-09 DIAGNOSIS — J3089 Other allergic rhinitis: Secondary | ICD-10-CM | POA: Diagnosis not present

## 2016-10-15 DIAGNOSIS — J3089 Other allergic rhinitis: Secondary | ICD-10-CM | POA: Diagnosis not present

## 2016-10-22 DIAGNOSIS — J3089 Other allergic rhinitis: Secondary | ICD-10-CM | POA: Diagnosis not present

## 2016-11-05 DIAGNOSIS — J3089 Other allergic rhinitis: Secondary | ICD-10-CM | POA: Diagnosis not present

## 2016-11-16 DIAGNOSIS — J3089 Other allergic rhinitis: Secondary | ICD-10-CM | POA: Diagnosis not present

## 2016-11-23 DIAGNOSIS — J3089 Other allergic rhinitis: Secondary | ICD-10-CM | POA: Diagnosis not present

## 2016-11-26 DIAGNOSIS — J3089 Other allergic rhinitis: Secondary | ICD-10-CM | POA: Diagnosis not present

## 2016-11-29 DIAGNOSIS — J3089 Other allergic rhinitis: Secondary | ICD-10-CM | POA: Diagnosis not present

## 2016-12-18 DIAGNOSIS — J3089 Other allergic rhinitis: Secondary | ICD-10-CM | POA: Diagnosis not present

## 2016-12-20 DIAGNOSIS — J3089 Other allergic rhinitis: Secondary | ICD-10-CM | POA: Diagnosis not present

## 2016-12-26 DIAGNOSIS — J3089 Other allergic rhinitis: Secondary | ICD-10-CM | POA: Diagnosis not present

## 2017-01-03 DIAGNOSIS — J3089 Other allergic rhinitis: Secondary | ICD-10-CM | POA: Diagnosis not present

## 2017-01-10 DIAGNOSIS — J3089 Other allergic rhinitis: Secondary | ICD-10-CM | POA: Diagnosis not present

## 2017-01-14 DIAGNOSIS — J3089 Other allergic rhinitis: Secondary | ICD-10-CM | POA: Diagnosis not present

## 2017-01-17 DIAGNOSIS — J3089 Other allergic rhinitis: Secondary | ICD-10-CM | POA: Diagnosis not present

## 2017-01-18 ENCOUNTER — Other Ambulatory Visit: Payer: Self-pay | Admitting: Internal Medicine

## 2017-01-18 DIAGNOSIS — Z1231 Encounter for screening mammogram for malignant neoplasm of breast: Secondary | ICD-10-CM

## 2017-01-21 DIAGNOSIS — J3089 Other allergic rhinitis: Secondary | ICD-10-CM | POA: Diagnosis not present

## 2017-01-24 DIAGNOSIS — J3089 Other allergic rhinitis: Secondary | ICD-10-CM | POA: Diagnosis not present

## 2017-01-31 DIAGNOSIS — J3089 Other allergic rhinitis: Secondary | ICD-10-CM | POA: Diagnosis not present

## 2017-02-04 DIAGNOSIS — J3089 Other allergic rhinitis: Secondary | ICD-10-CM | POA: Diagnosis not present

## 2017-02-06 DIAGNOSIS — Z0000 Encounter for general adult medical examination without abnormal findings: Secondary | ICD-10-CM | POA: Diagnosis not present

## 2017-02-06 DIAGNOSIS — E7849 Other hyperlipidemia: Secondary | ICD-10-CM | POA: Diagnosis not present

## 2017-02-07 ENCOUNTER — Ambulatory Visit
Admission: RE | Admit: 2017-02-07 | Discharge: 2017-02-07 | Disposition: A | Discharge status: Home / Self Care | Payer: BLUE CROSS/BLUE SHIELD | Source: Ambulatory Visit | Attending: Internal Medicine | Admitting: Internal Medicine

## 2017-02-07 DIAGNOSIS — Z1231 Encounter for screening mammogram for malignant neoplasm of breast: Secondary | ICD-10-CM | POA: Diagnosis not present

## 2017-02-11 DIAGNOSIS — J3089 Other allergic rhinitis: Secondary | ICD-10-CM | POA: Diagnosis not present

## 2017-02-13 DIAGNOSIS — M1990 Unspecified osteoarthritis, unspecified site: Secondary | ICD-10-CM | POA: Diagnosis not present

## 2017-02-13 DIAGNOSIS — Z1389 Encounter for screening for other disorder: Secondary | ICD-10-CM | POA: Diagnosis not present

## 2017-02-13 DIAGNOSIS — J302 Other seasonal allergic rhinitis: Secondary | ICD-10-CM | POA: Diagnosis not present

## 2017-02-13 DIAGNOSIS — Z0000 Encounter for general adult medical examination without abnormal findings: Secondary | ICD-10-CM | POA: Diagnosis not present

## 2017-02-13 DIAGNOSIS — K219 Gastro-esophageal reflux disease without esophagitis: Secondary | ICD-10-CM | POA: Diagnosis not present

## 2017-02-13 DIAGNOSIS — E7849 Other hyperlipidemia: Secondary | ICD-10-CM | POA: Diagnosis not present

## 2017-02-19 DIAGNOSIS — J3089 Other allergic rhinitis: Secondary | ICD-10-CM | POA: Diagnosis not present

## 2017-02-27 DIAGNOSIS — J3089 Other allergic rhinitis: Secondary | ICD-10-CM | POA: Diagnosis not present

## 2017-03-04 DIAGNOSIS — J3089 Other allergic rhinitis: Secondary | ICD-10-CM | POA: Diagnosis not present

## 2017-03-15 DIAGNOSIS — J3089 Other allergic rhinitis: Secondary | ICD-10-CM | POA: Diagnosis not present

## 2017-03-18 DIAGNOSIS — J3089 Other allergic rhinitis: Secondary | ICD-10-CM | POA: Diagnosis not present

## 2017-03-26 DIAGNOSIS — J3089 Other allergic rhinitis: Secondary | ICD-10-CM | POA: Diagnosis not present

## 2017-04-02 DIAGNOSIS — J3089 Other allergic rhinitis: Secondary | ICD-10-CM | POA: Diagnosis not present

## 2017-04-16 DIAGNOSIS — H1032 Unspecified acute conjunctivitis, left eye: Secondary | ICD-10-CM | POA: Diagnosis not present

## 2017-04-18 DIAGNOSIS — M79671 Pain in right foot: Secondary | ICD-10-CM | POA: Diagnosis not present

## 2017-04-18 DIAGNOSIS — M25572 Pain in left ankle and joints of left foot: Secondary | ICD-10-CM | POA: Diagnosis not present

## 2017-04-18 DIAGNOSIS — M79672 Pain in left foot: Secondary | ICD-10-CM | POA: Diagnosis not present

## 2017-04-19 DIAGNOSIS — J3089 Other allergic rhinitis: Secondary | ICD-10-CM | POA: Diagnosis not present

## 2017-04-22 DIAGNOSIS — J3089 Other allergic rhinitis: Secondary | ICD-10-CM | POA: Diagnosis not present

## 2017-04-26 DIAGNOSIS — H16103 Unspecified superficial keratitis, bilateral: Secondary | ICD-10-CM | POA: Diagnosis not present

## 2017-04-26 DIAGNOSIS — H1789 Other corneal scars and opacities: Secondary | ICD-10-CM | POA: Diagnosis not present

## 2017-04-26 DIAGNOSIS — H18603 Keratoconus, unspecified, bilateral: Secondary | ICD-10-CM | POA: Diagnosis not present

## 2017-05-02 DIAGNOSIS — M25572 Pain in left ankle and joints of left foot: Secondary | ICD-10-CM | POA: Diagnosis not present

## 2017-05-08 DIAGNOSIS — J3089 Other allergic rhinitis: Secondary | ICD-10-CM | POA: Diagnosis not present

## 2017-05-13 DIAGNOSIS — J3089 Other allergic rhinitis: Secondary | ICD-10-CM | POA: Diagnosis not present

## 2017-05-22 DIAGNOSIS — J3089 Other allergic rhinitis: Secondary | ICD-10-CM | POA: Diagnosis not present

## 2017-05-28 DIAGNOSIS — J3089 Other allergic rhinitis: Secondary | ICD-10-CM | POA: Diagnosis not present

## 2017-05-30 DIAGNOSIS — J3089 Other allergic rhinitis: Secondary | ICD-10-CM | POA: Diagnosis not present

## 2017-06-04 DIAGNOSIS — J3089 Other allergic rhinitis: Secondary | ICD-10-CM | POA: Diagnosis not present

## 2017-06-10 DIAGNOSIS — J301 Allergic rhinitis due to pollen: Secondary | ICD-10-CM | POA: Diagnosis not present

## 2017-06-18 DIAGNOSIS — J3089 Other allergic rhinitis: Secondary | ICD-10-CM | POA: Diagnosis not present

## 2017-06-21 DIAGNOSIS — J3089 Other allergic rhinitis: Secondary | ICD-10-CM | POA: Diagnosis not present

## 2017-06-24 DIAGNOSIS — J3089 Other allergic rhinitis: Secondary | ICD-10-CM | POA: Diagnosis not present

## 2017-06-27 DIAGNOSIS — J3089 Other allergic rhinitis: Secondary | ICD-10-CM | POA: Diagnosis not present

## 2017-07-01 DIAGNOSIS — J3089 Other allergic rhinitis: Secondary | ICD-10-CM | POA: Diagnosis not present

## 2017-07-08 DIAGNOSIS — J3089 Other allergic rhinitis: Secondary | ICD-10-CM | POA: Diagnosis not present

## 2017-07-15 DIAGNOSIS — J3089 Other allergic rhinitis: Secondary | ICD-10-CM | POA: Diagnosis not present

## 2017-07-24 DIAGNOSIS — J3089 Other allergic rhinitis: Secondary | ICD-10-CM | POA: Diagnosis not present

## 2017-07-29 DIAGNOSIS — J3089 Other allergic rhinitis: Secondary | ICD-10-CM | POA: Diagnosis not present

## 2017-08-14 DIAGNOSIS — J3089 Other allergic rhinitis: Secondary | ICD-10-CM | POA: Diagnosis not present

## 2017-08-22 DIAGNOSIS — J3089 Other allergic rhinitis: Secondary | ICD-10-CM | POA: Diagnosis not present

## 2017-08-29 DIAGNOSIS — J3089 Other allergic rhinitis: Secondary | ICD-10-CM | POA: Diagnosis not present

## 2017-09-04 DIAGNOSIS — J3089 Other allergic rhinitis: Secondary | ICD-10-CM | POA: Diagnosis not present

## 2017-09-12 DIAGNOSIS — J3089 Other allergic rhinitis: Secondary | ICD-10-CM | POA: Diagnosis not present

## 2017-09-16 DIAGNOSIS — J3089 Other allergic rhinitis: Secondary | ICD-10-CM | POA: Diagnosis not present

## 2017-09-16 DIAGNOSIS — J301 Allergic rhinitis due to pollen: Secondary | ICD-10-CM | POA: Diagnosis not present

## 2017-09-24 DIAGNOSIS — J301 Allergic rhinitis due to pollen: Secondary | ICD-10-CM | POA: Diagnosis not present

## 2017-10-01 DIAGNOSIS — J3089 Other allergic rhinitis: Secondary | ICD-10-CM | POA: Diagnosis not present

## 2017-10-07 DIAGNOSIS — J3089 Other allergic rhinitis: Secondary | ICD-10-CM | POA: Diagnosis not present

## 2017-10-15 DIAGNOSIS — J301 Allergic rhinitis due to pollen: Secondary | ICD-10-CM | POA: Diagnosis not present

## 2017-10-15 DIAGNOSIS — J3089 Other allergic rhinitis: Secondary | ICD-10-CM | POA: Diagnosis not present

## 2017-10-25 DIAGNOSIS — J3089 Other allergic rhinitis: Secondary | ICD-10-CM | POA: Diagnosis not present

## 2017-10-25 DIAGNOSIS — J301 Allergic rhinitis due to pollen: Secondary | ICD-10-CM | POA: Diagnosis not present

## 2017-10-30 DIAGNOSIS — J3089 Other allergic rhinitis: Secondary | ICD-10-CM | POA: Diagnosis not present

## 2017-11-06 DIAGNOSIS — J3089 Other allergic rhinitis: Secondary | ICD-10-CM | POA: Diagnosis not present

## 2017-11-15 DIAGNOSIS — J3089 Other allergic rhinitis: Secondary | ICD-10-CM | POA: Diagnosis not present

## 2017-11-18 DIAGNOSIS — J3089 Other allergic rhinitis: Secondary | ICD-10-CM | POA: Diagnosis not present

## 2017-11-30 IMAGING — MG 2D DIGITAL SCREENING BILATERAL MAMMOGRAM WITH CAD AND ADJUNCT TO
8 of 12 series · 8 of 28 positions shown · non-contrast
Comparison: Previous exam(s).

CLINICAL DATA: Screening.

EXAM:
2D DIGITAL SCREENING BILATERAL MAMMOGRAM WITH CAD AND ADJUNCT TOMO

[R CC]
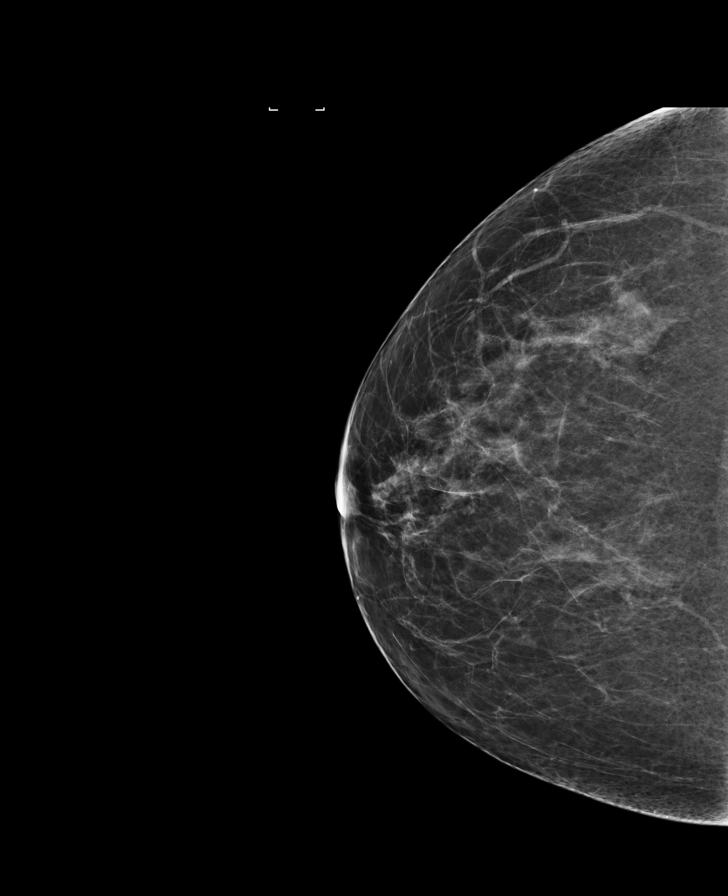

[L MLO synth-2D]
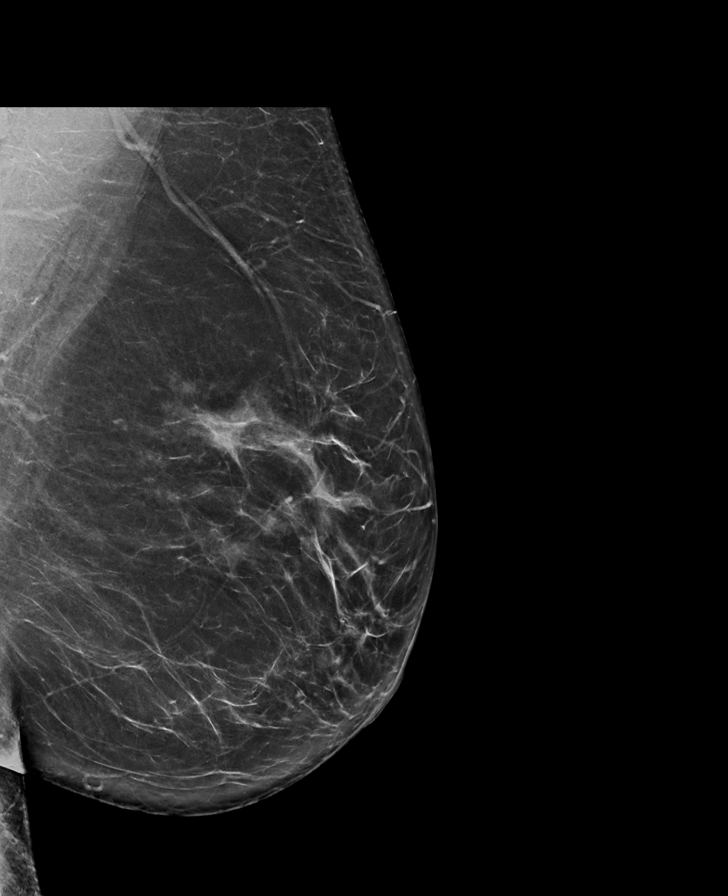

[R MLO synth-2D]
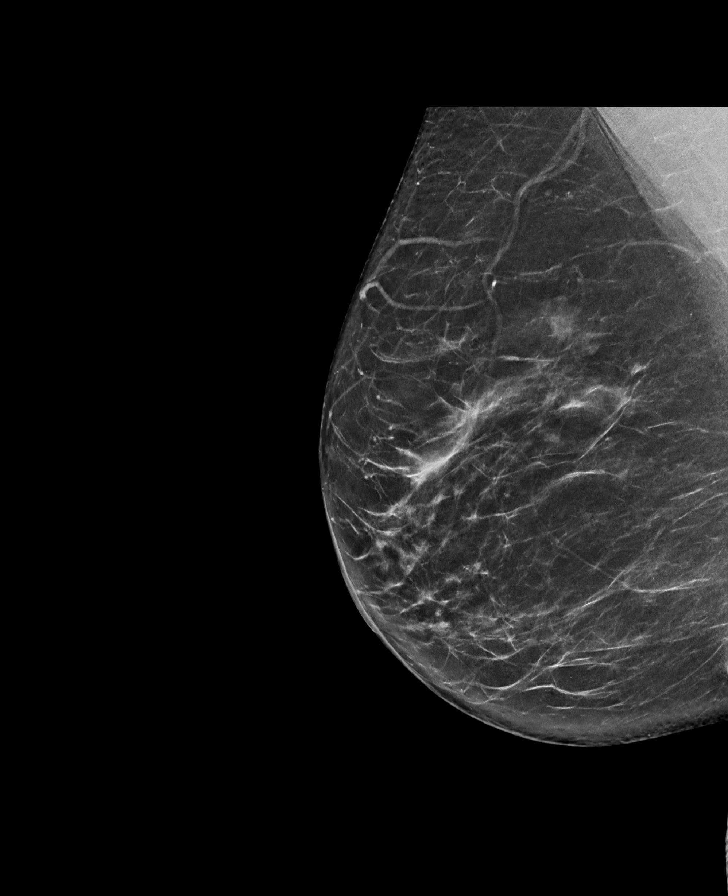

[L CC synth-2D]
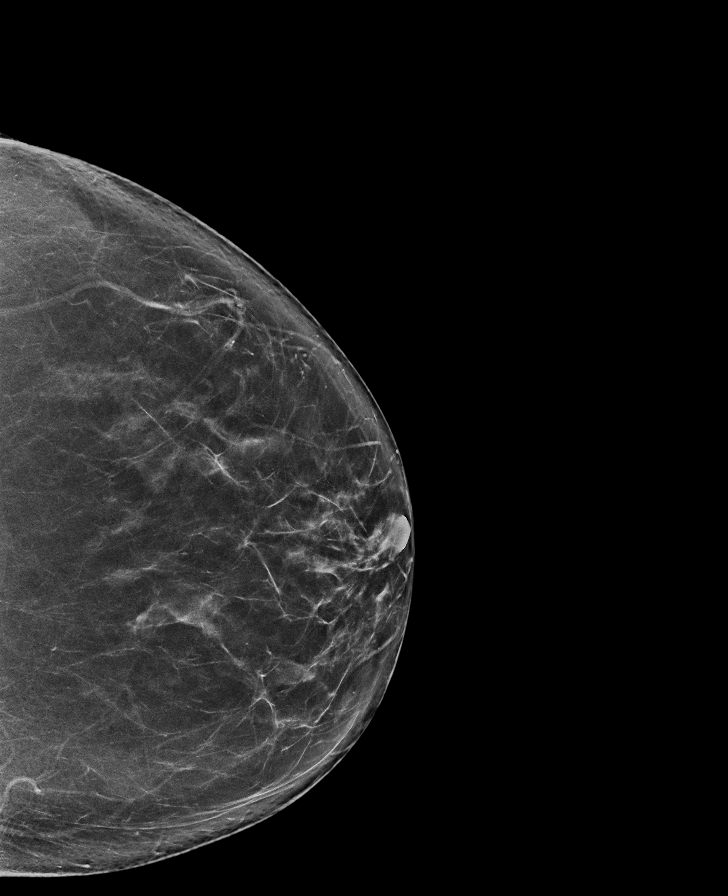

[L MLO]
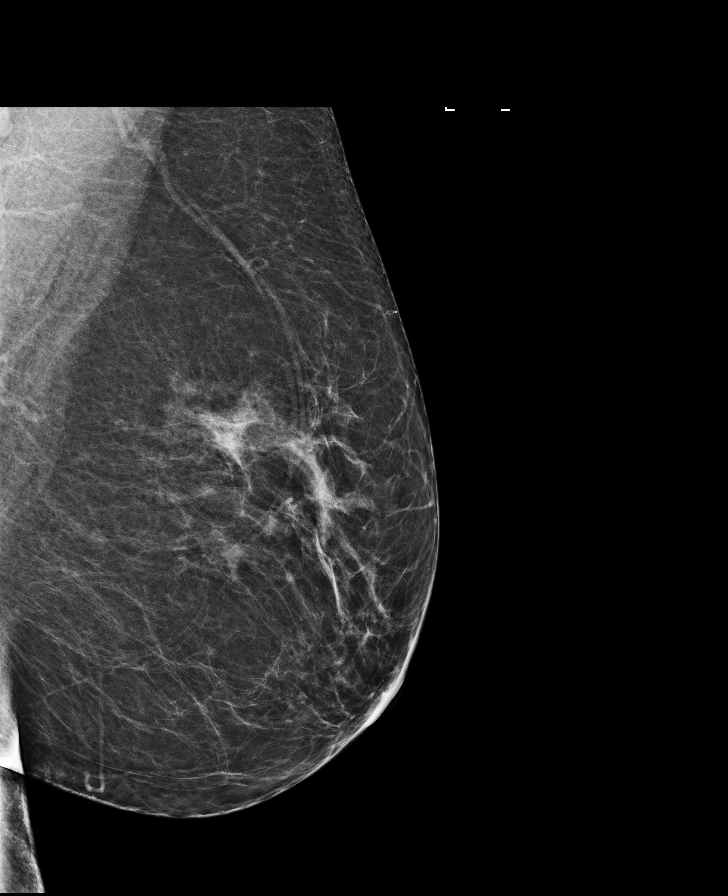

[R CC synth-2D]
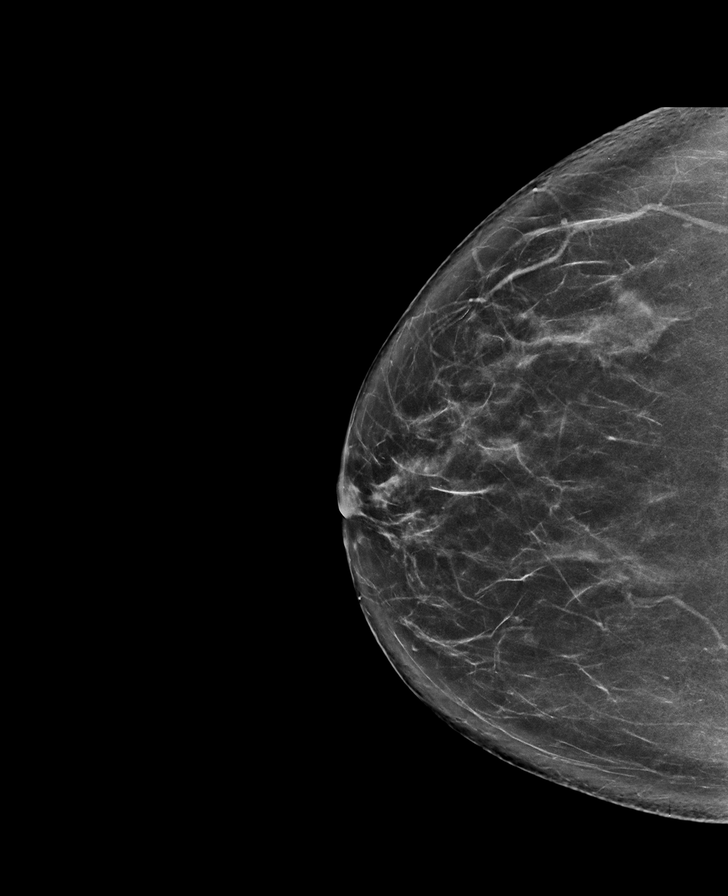

[L CC]
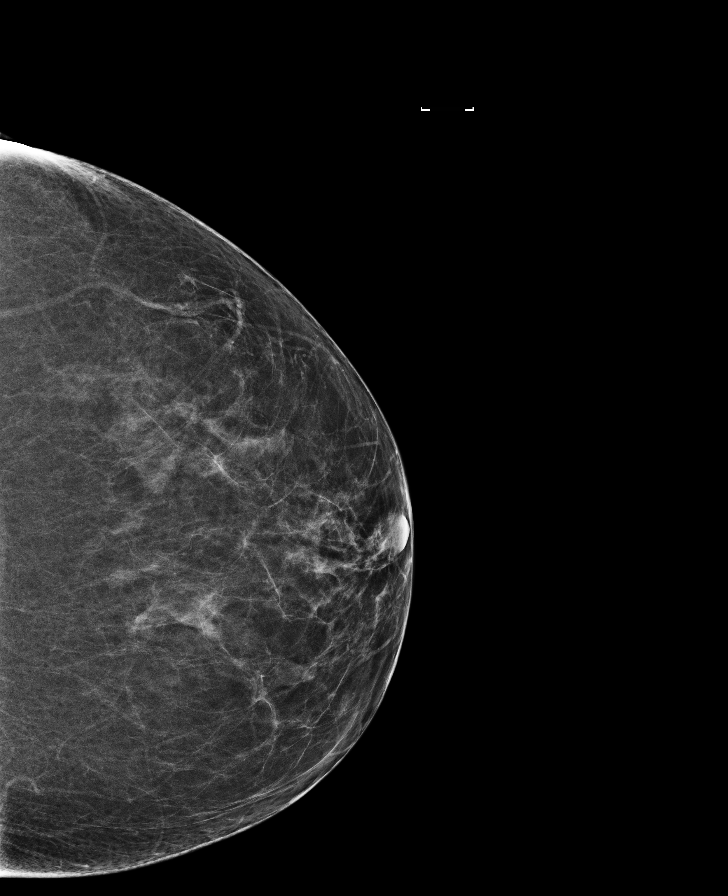

[R MLO]
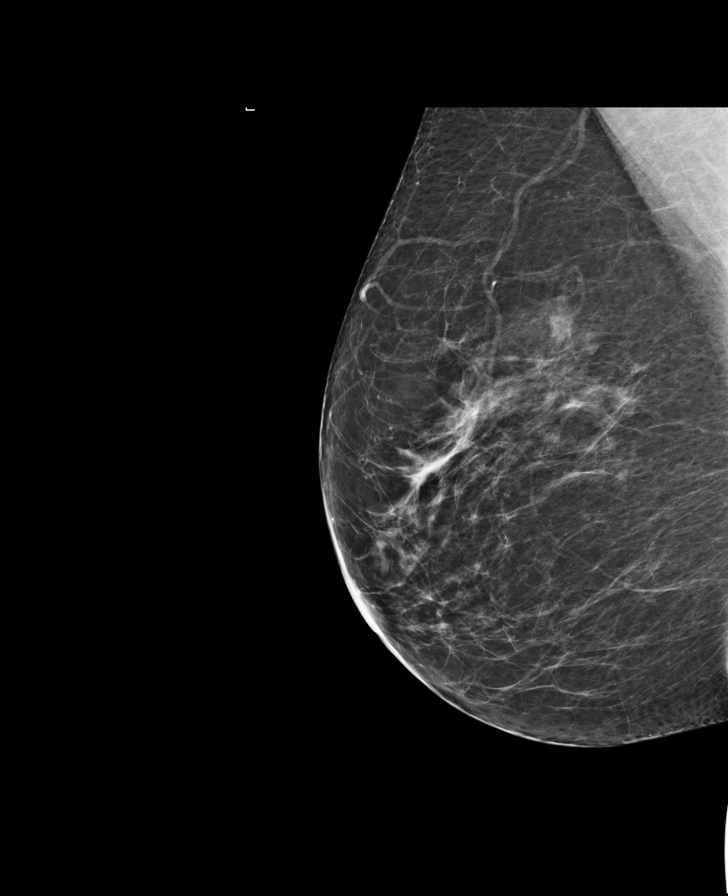

[8 of 28 positions shown; findings below may reference images not displayed]

ACR Breast Density Category b: There are scattered areas of
fibroglandular density.
FINDINGS: There are no findings suspicious for malignancy. Images were
processed with CAD.
IMPRESSION: No mammographic evidence of malignancy. A result letter of this
screening mammogram will be mailed directly to the patient.

RECOMMENDATION:
Screening mammogram in one year. (Code:97-6-RS4)

BI-RADS CATEGORY  1: Negative.

## 2017-12-09 DIAGNOSIS — J3089 Other allergic rhinitis: Secondary | ICD-10-CM | POA: Diagnosis not present

## 2017-12-10 DIAGNOSIS — L570 Actinic keratosis: Secondary | ICD-10-CM | POA: Diagnosis not present

## 2017-12-13 DIAGNOSIS — J3089 Other allergic rhinitis: Secondary | ICD-10-CM | POA: Diagnosis not present

## 2017-12-17 DIAGNOSIS — J3089 Other allergic rhinitis: Secondary | ICD-10-CM | POA: Diagnosis not present

## 2017-12-19 DIAGNOSIS — J3089 Other allergic rhinitis: Secondary | ICD-10-CM | POA: Diagnosis not present

## 2017-12-27 DIAGNOSIS — H18603 Keratoconus, unspecified, bilateral: Secondary | ICD-10-CM | POA: Diagnosis not present

## 2017-12-27 DIAGNOSIS — H52202 Unspecified astigmatism, left eye: Secondary | ICD-10-CM | POA: Diagnosis not present

## 2017-12-27 DIAGNOSIS — J3089 Other allergic rhinitis: Secondary | ICD-10-CM | POA: Diagnosis not present

## 2017-12-27 DIAGNOSIS — H1789 Other corneal scars and opacities: Secondary | ICD-10-CM | POA: Diagnosis not present

## 2017-12-27 DIAGNOSIS — H04123 Dry eye syndrome of bilateral lacrimal glands: Secondary | ICD-10-CM | POA: Diagnosis not present

## 2018-01-02 DIAGNOSIS — J3089 Other allergic rhinitis: Secondary | ICD-10-CM | POA: Diagnosis not present

## 2018-01-09 DIAGNOSIS — J3089 Other allergic rhinitis: Secondary | ICD-10-CM | POA: Diagnosis not present

## 2018-01-13 DIAGNOSIS — J3089 Other allergic rhinitis: Secondary | ICD-10-CM | POA: Diagnosis not present

## 2018-01-15 DIAGNOSIS — Z23 Encounter for immunization: Secondary | ICD-10-CM | POA: Diagnosis not present

## 2018-01-15 DIAGNOSIS — E7849 Other hyperlipidemia: Secondary | ICD-10-CM | POA: Diagnosis not present

## 2018-01-15 DIAGNOSIS — M1990 Unspecified osteoarthritis, unspecified site: Secondary | ICD-10-CM | POA: Diagnosis not present

## 2018-01-24 DIAGNOSIS — J3089 Other allergic rhinitis: Secondary | ICD-10-CM | POA: Diagnosis not present

## 2018-01-30 DIAGNOSIS — J3089 Other allergic rhinitis: Secondary | ICD-10-CM | POA: Diagnosis not present

## 2018-02-07 DIAGNOSIS — J3089 Other allergic rhinitis: Secondary | ICD-10-CM | POA: Diagnosis not present

## 2018-02-14 DIAGNOSIS — J3089 Other allergic rhinitis: Secondary | ICD-10-CM | POA: Diagnosis not present

## 2018-02-18 DIAGNOSIS — J3089 Other allergic rhinitis: Secondary | ICD-10-CM | POA: Diagnosis not present

## 2018-02-26 DIAGNOSIS — J3089 Other allergic rhinitis: Secondary | ICD-10-CM | POA: Diagnosis not present

## 2018-03-11 DIAGNOSIS — J3089 Other allergic rhinitis: Secondary | ICD-10-CM | POA: Diagnosis not present

## 2018-03-21 DIAGNOSIS — J3089 Other allergic rhinitis: Secondary | ICD-10-CM | POA: Diagnosis not present

## 2018-03-27 DIAGNOSIS — J3089 Other allergic rhinitis: Secondary | ICD-10-CM | POA: Diagnosis not present

## 2018-04-01 ENCOUNTER — Other Ambulatory Visit: Payer: Self-pay | Admitting: Internal Medicine

## 2018-04-01 DIAGNOSIS — Z1231 Encounter for screening mammogram for malignant neoplasm of breast: Secondary | ICD-10-CM

## 2018-04-02 DIAGNOSIS — J3089 Other allergic rhinitis: Secondary | ICD-10-CM | POA: Diagnosis not present

## 2018-04-11 DIAGNOSIS — J3089 Other allergic rhinitis: Secondary | ICD-10-CM | POA: Diagnosis not present

## 2018-04-15 DIAGNOSIS — J3089 Other allergic rhinitis: Secondary | ICD-10-CM | POA: Diagnosis not present

## 2018-05-01 ENCOUNTER — Ambulatory Visit
Admission: RE | Admit: 2018-05-01 | Discharge: 2018-05-01 | Disposition: A | Discharge status: Home / Self Care | Payer: BLUE CROSS/BLUE SHIELD | Source: Ambulatory Visit | Attending: Internal Medicine | Admitting: Internal Medicine

## 2018-05-01 DIAGNOSIS — Z1231 Encounter for screening mammogram for malignant neoplasm of breast: Secondary | ICD-10-CM

## 2018-06-04 DIAGNOSIS — J3089 Other allergic rhinitis: Secondary | ICD-10-CM | POA: Diagnosis not present

## 2018-07-17 DIAGNOSIS — J3089 Other allergic rhinitis: Secondary | ICD-10-CM | POA: Diagnosis not present

## 2018-07-23 DIAGNOSIS — J3089 Other allergic rhinitis: Secondary | ICD-10-CM | POA: Diagnosis not present

## 2018-07-29 DIAGNOSIS — J3089 Other allergic rhinitis: Secondary | ICD-10-CM | POA: Diagnosis not present

## 2018-09-16 DIAGNOSIS — Z0000 Encounter for general adult medical examination without abnormal findings: Secondary | ICD-10-CM | POA: Diagnosis not present

## 2018-09-16 DIAGNOSIS — E7849 Other hyperlipidemia: Secondary | ICD-10-CM | POA: Diagnosis not present

## 2018-09-18 DIAGNOSIS — M1990 Unspecified osteoarthritis, unspecified site: Secondary | ICD-10-CM | POA: Diagnosis not present

## 2018-09-18 DIAGNOSIS — J302 Other seasonal allergic rhinitis: Secondary | ICD-10-CM | POA: Diagnosis not present

## 2018-09-18 DIAGNOSIS — K219 Gastro-esophageal reflux disease without esophagitis: Secondary | ICD-10-CM | POA: Diagnosis not present

## 2018-09-18 DIAGNOSIS — Z0000 Encounter for general adult medical examination without abnormal findings: Secondary | ICD-10-CM | POA: Diagnosis not present

## 2018-09-18 DIAGNOSIS — Z1331 Encounter for screening for depression: Secondary | ICD-10-CM | POA: Diagnosis not present

## 2018-09-18 DIAGNOSIS — E785 Hyperlipidemia, unspecified: Secondary | ICD-10-CM | POA: Diagnosis not present

## 2018-12-09 DIAGNOSIS — L821 Other seborrheic keratosis: Secondary | ICD-10-CM | POA: Diagnosis not present

## 2018-12-09 DIAGNOSIS — L820 Inflamed seborrheic keratosis: Secondary | ICD-10-CM | POA: Diagnosis not present

## 2019-01-19 DIAGNOSIS — J3089 Other allergic rhinitis: Secondary | ICD-10-CM | POA: Diagnosis not present

## 2019-04-15 ENCOUNTER — Other Ambulatory Visit: Payer: Self-pay | Admitting: Internal Medicine

## 2019-04-15 DIAGNOSIS — Z1231 Encounter for screening mammogram for malignant neoplasm of breast: Secondary | ICD-10-CM

## 2019-06-04 ENCOUNTER — Ambulatory Visit: Payer: BLUE CROSS/BLUE SHIELD

## 2019-06-10 ENCOUNTER — Ambulatory Visit: Payer: BC Managed Care – PPO

## 2019-06-22 DIAGNOSIS — H43811 Vitreous degeneration, right eye: Secondary | ICD-10-CM | POA: Diagnosis not present

## 2019-06-22 DIAGNOSIS — H524 Presbyopia: Secondary | ICD-10-CM | POA: Diagnosis not present

## 2019-07-15 ENCOUNTER — Other Ambulatory Visit: Payer: Self-pay

## 2019-07-15 ENCOUNTER — Ambulatory Visit
Admission: RE | Admit: 2019-07-15 | Discharge: 2019-07-15 | Disposition: A | Discharge status: Home / Self Care | Payer: BC Managed Care – PPO | Source: Ambulatory Visit | Attending: Internal Medicine | Admitting: Internal Medicine

## 2019-07-15 DIAGNOSIS — Z1231 Encounter for screening mammogram for malignant neoplasm of breast: Secondary | ICD-10-CM

## 2019-07-21 DIAGNOSIS — H43811 Vitreous degeneration, right eye: Secondary | ICD-10-CM | POA: Diagnosis not present

## 2019-08-20 DIAGNOSIS — L821 Other seborrheic keratosis: Secondary | ICD-10-CM | POA: Diagnosis not present

## 2019-08-20 DIAGNOSIS — L738 Other specified follicular disorders: Secondary | ICD-10-CM | POA: Diagnosis not present

## 2019-08-20 DIAGNOSIS — Z85828 Personal history of other malignant neoplasm of skin: Secondary | ICD-10-CM | POA: Diagnosis not present

## 2019-09-02 DIAGNOSIS — R21 Rash and other nonspecific skin eruption: Secondary | ICD-10-CM | POA: Diagnosis not present

## 2019-09-02 DIAGNOSIS — B029 Zoster without complications: Secondary | ICD-10-CM | POA: Diagnosis not present

## 2019-10-28 DIAGNOSIS — H524 Presbyopia: Secondary | ICD-10-CM | POA: Diagnosis not present

## 2019-10-28 DIAGNOSIS — H18603 Keratoconus, unspecified, bilateral: Secondary | ICD-10-CM | POA: Diagnosis not present

## 2019-10-28 DIAGNOSIS — H1789 Other corneal scars and opacities: Secondary | ICD-10-CM | POA: Diagnosis not present

## 2019-10-28 DIAGNOSIS — H25813 Combined forms of age-related cataract, bilateral: Secondary | ICD-10-CM | POA: Diagnosis not present

## 2019-11-10 DIAGNOSIS — Z0000 Encounter for general adult medical examination without abnormal findings: Secondary | ICD-10-CM | POA: Diagnosis not present

## 2019-11-10 DIAGNOSIS — E7849 Other hyperlipidemia: Secondary | ICD-10-CM | POA: Diagnosis not present

## 2019-11-11 DIAGNOSIS — M1990 Unspecified osteoarthritis, unspecified site: Secondary | ICD-10-CM | POA: Diagnosis not present

## 2019-11-11 DIAGNOSIS — Z0000 Encounter for general adult medical examination without abnormal findings: Secondary | ICD-10-CM | POA: Diagnosis not present

## 2019-11-11 DIAGNOSIS — Z1331 Encounter for screening for depression: Secondary | ICD-10-CM | POA: Diagnosis not present

## 2019-11-11 DIAGNOSIS — E785 Hyperlipidemia, unspecified: Secondary | ICD-10-CM | POA: Diagnosis not present

## 2019-11-11 DIAGNOSIS — K219 Gastro-esophageal reflux disease without esophagitis: Secondary | ICD-10-CM | POA: Diagnosis not present

## 2019-11-11 DIAGNOSIS — F329 Major depressive disorder, single episode, unspecified: Secondary | ICD-10-CM | POA: Diagnosis not present

## 2019-11-23 DIAGNOSIS — H16103 Unspecified superficial keratitis, bilateral: Secondary | ICD-10-CM | POA: Diagnosis not present

## 2019-11-23 DIAGNOSIS — H1789 Other corneal scars and opacities: Secondary | ICD-10-CM | POA: Diagnosis not present

## 2019-11-23 DIAGNOSIS — H18602 Keratoconus, unspecified, left eye: Secondary | ICD-10-CM | POA: Diagnosis not present

## 2019-11-23 DIAGNOSIS — H04123 Dry eye syndrome of bilateral lacrimal glands: Secondary | ICD-10-CM | POA: Diagnosis not present

## 2020-06-15 ENCOUNTER — Encounter: Payer: Self-pay | Admitting: Gastroenterology

## 2020-07-12 ENCOUNTER — Encounter: Payer: Self-pay | Admitting: Gastroenterology

## 2020-07-15 ENCOUNTER — Other Ambulatory Visit: Payer: Self-pay | Admitting: Internal Medicine

## 2020-07-15 DIAGNOSIS — Z1231 Encounter for screening mammogram for malignant neoplasm of breast: Secondary | ICD-10-CM

## 2020-08-04 ENCOUNTER — Other Ambulatory Visit: Payer: Self-pay

## 2020-08-04 ENCOUNTER — Ambulatory Visit
Admission: RE | Admit: 2020-08-04 | Discharge: 2020-08-04 | Disposition: A | Discharge status: Home / Self Care | Payer: Medicare Other | Source: Ambulatory Visit | Attending: Internal Medicine | Admitting: Internal Medicine

## 2020-08-04 DIAGNOSIS — Z1231 Encounter for screening mammogram for malignant neoplasm of breast: Secondary | ICD-10-CM

## 2020-09-08 ENCOUNTER — Other Ambulatory Visit: Payer: Self-pay

## 2020-09-08 ENCOUNTER — Ambulatory Visit: Payer: Medicare Other

## 2020-09-08 VITALS — Ht 67.0 in | Wt 231.0 lb

## 2020-09-08 DIAGNOSIS — Z800 Family history of malignant neoplasm of digestive organs: Secondary | ICD-10-CM

## 2020-09-08 DIAGNOSIS — Z86010 Personal history of colonic polyps: Secondary | ICD-10-CM

## 2020-09-08 MED ORDER — PEG 3350-KCL-NA BICARB-NACL 420 G PO SOLR: 4000.0000 mL | Freq: Once | ORAL | 0 refills | Status: AC

## 2020-09-08 NOTE — Progress Notes (Signed)
No allergies to soy or egg Pt is not on blood thinners or diet pills Denies issues with sedation/intubation Denies atrial flutter/fib Denies constipation   Emmi instructions given to pt  Pt is aware of Covid safety and care partner requirements.  

## 2020-09-22 ENCOUNTER — Encounter: Payer: Self-pay | Admitting: Gastroenterology

## 2020-09-22 ENCOUNTER — Ambulatory Visit (AMBULATORY_SURGERY_CENTER): Payer: Medicare Other | Admitting: Gastroenterology

## 2020-09-22 ENCOUNTER — Other Ambulatory Visit: Payer: Self-pay

## 2020-09-22 VITALS — BP 103/73 | HR 60 | Temp 97.8°F | Resp 17 | Ht 67.0 in | Wt 231.0 lb

## 2020-09-22 DIAGNOSIS — Z800 Family history of malignant neoplasm of digestive organs: Secondary | ICD-10-CM

## 2020-09-22 DIAGNOSIS — Z86010 Personal history of colonic polyps: Secondary | ICD-10-CM

## 2020-09-22 MED ORDER — SODIUM CHLORIDE 0.9 % IV SOLN: 500.0000 mL | Freq: Once | INTRAVENOUS | Status: DC

## 2020-09-22 NOTE — Patient Instructions (Addendum)
Handout given on diverticulosis  YOU HAD AN ENDOSCOPIC PROCEDURE TODAY AT [place]:   Refer to the procedure report that was given to you for any specific questions about what was found during the examination.  If the procedure report does not answer your questions, please call your gastroenterologist to clarify.  If you requested that your care partner not be given the details of your procedure findings, then the procedure report has been included in a sealed envelope for you to review at your convenience later.  YOU SHOULD EXPECT: Some feelings of bloating in the abdomen. Passage of more gas than usual.  Walking can help get rid of the air that was put into your GI tract during the procedure and reduce the bloating. If you had a lower endoscopy (such as a colonoscopy or flexible sigmoidoscopy) you may notice spotting of blood in your stool or on the toilet paper. If you underwent a bowel prep for your procedure, you may not have a normal bowel movement for a few days.  Please Note:  You might notice some irritation and congestion in your nose or some drainage.  This is from the oxygen used during your procedure.  There is no need for concern and it should clear up in a day or so.  SYMPTOMS TO REPORT IMMEDIATELY:   Following lower endoscopy (colonoscopy or flexible sigmoidoscopy):  Excessive amounts of blood in the stool  Significant tenderness or worsening of abdominal pains  Swelling of the abdomen that is new, acute  Fever of 100F or higher   For urgent or emergent issues, a gastroenterologist can be reached at any hour by calling [phone]. Do not use MyChart messaging for urgent concerns.    DIET:  We do recommend a small meal at first, but then you may proceed to your regular diet.  Drink plenty of fluids but you should avoid alcoholic beverages for 24 hours.  ACTIVITY:  You should plan to take it easy for the rest of today and you should NOT DRIVE or use  heavy machinery until tomorrow (because of the sedation medicines used during the test).    FOLLOW UP: Our staff will call the number listed on your records 48-72 hours following your procedure to check on you and address any questions or concerns that you may have regarding the information given to you following your procedure. If we do not reach you, we will leave a message.  We will attempt to reach you two times.  During this call, we will ask if you have developed any symptoms of COVID 19. If you develop any symptoms (ie: fever, flu-like symptoms, shortness of breath, cough etc.) before then, please call [phone].  If you test positive for Covid 19 in the 2 weeks post procedure, please call and report this information to [place].    If any biopsies were taken you will be contacted by phone or by letter within the next 1-3 weeks.  Please call us at [phone] if you have not heard about the biopsies in 3 weeks.    SIGNATURES/CONFIDENTIALITY: You and/or your care partner have signed paperwork which will be entered into your electronic medical record.  These signatures attest to the fact that that the information above on your After Visit Summary has been reviewed and is understood.  Full responsibility of the confidentiality of this discharge information lies with you and/or your care-partner.

## 2020-09-22 NOTE — Progress Notes (Signed)
Report given to PACU, vss 

## 2020-09-22 NOTE — Progress Notes (Signed)
Pt's states no medical or surgical changes since previsit or office visit. 

## 2020-09-22 NOTE — Op Note (Addendum)
[place] Patient Name: Nykira Reddix Procedure Date: 09/22/2020 11:16 AM MRN: [id number] Endoscopist: Ladene Artist , MD Age: 65 Referring MD:  Date of Birth: 1956-02-04 Gender: Female Account #: [id number] Procedure:                Colonoscopy Indications:              Surveillance: Personal history of adenomatous                            polyps on last colonoscopy > 5 years ago. Family                            history of colon cancer, first degree relative < 44. Medicines:                Monitored Anesthesia Care Procedure:                Pre-Anesthesia Assessment:                           - Prior to the procedure, a History and Physical                            was performed, and patient medications and                            allergies were reviewed. The patient's tolerance of                            previous anesthesia was also reviewed. The risks                            and benefits of the procedure and the sedation                            options and risks were discussed with the patient.                            All questions were answered, and informed consent                            was obtained. Prior Anticoagulants: The patient has                            taken no previous anticoagulant or antiplatelet                            agents. ASA Grade Assessment: II - A patient with                            mild systemic disease. After reviewing the risks                            and benefits, the patient was deemed in  satisfactory condition to undergo the procedure.                           After obtaining informed consent, the colonoscope                            was passed under direct vision. Throughout the                            procedure, the patient's blood pressure, pulse, and                            oxygen saturations were monitored continuously. The                             Colonoscope was introduced through the anus and                            advanced to the the cecum, identified by                            appendiceal orifice and ileocecal valve. The                            ileocecal valve, appendiceal orifice, and rectum                            were photographed. The quality of the bowel                            preparation was good. The colonoscopy was performed                            without difficulty. The patient tolerated the                            procedure well. Scope In: 11:30:53 AM Scope Out: 11:47:29 AM Scope Withdrawal Time: 0 hours 11 minutes 28 seconds  Total Procedure Duration: 0 hours 16 minutes 36 seconds  Findings:                 The perianal and digital rectal examinations were                            normal.                           A few small-mouthed diverticula were found in the                            left colon.                           The exam was otherwise without abnormality on  direct and retroflexion views. Complications:            No immediate complications. Estimated blood loss:                            None. Estimated Blood Loss:     Estimated blood loss: none. Impression:               - Mild diverticulosis in the left colon.                           - The examination was otherwise normal on direct                            and retroflexion views.                           - No specimens collected. Recommendation:           - Repeat colonoscopy in 5 years for surveillance.                           - Patient has a contact number available for                            emergencies. The signs and symptoms of potential                            delayed complications were discussed with the                            patient. Return to normal activities tomorrow.                            Written discharge instructions were provided to the                             patient.                           - Resume previous diet.                           - Continue present medications. Ladene Artist, MD 09/22/2020 11:50:03 AM This report has been signed electronically.

## 2020-09-26 ENCOUNTER — Telehealth: Payer: Self-pay

## 2020-09-26 NOTE — Telephone Encounter (Signed)
  Follow up Call-  Call back number 09/22/2020  Post procedure Call Back phone  # [phone]  Permission to leave phone message Yes  Some recent data might be hidden     Patient questions:  Do you have a fever, pain , or abdominal swelling? No. Pain Score  0 *  Have you tolerated food without any problems? Yes.    Have you been able to return to your normal activities? Yes.    Do you have any questions about your discharge instructions: Diet   No. Medications  No. Follow up visit  No.  Do you have questions or concerns about your Care? No.  Actions: * If pain score is 4 or above: No action needed, pain <4.  1. Have you developed a fever since your procedure? mo  2.   Have you had an respiratory symptoms (SOB or cough) since your procedure? no  3.   Have you tested positive for COVID 19 since your procedure no  4.   Have you had any family members/close contacts diagnosed with the COVID 19 since your procedure?  no   If yes to any of these questions please route to Joylene John, RN and Joella Prince, RN

## 2021-03-11 DIAGNOSIS — R079 Chest pain, unspecified: Secondary | ICD-10-CM

## 2021-05-30 ENCOUNTER — Ambulatory Visit (INDEPENDENT_AMBULATORY_CARE_PROVIDER_SITE_OTHER): Payer: Medicare Other | Admitting: Gastroenterology

## 2021-05-30 ENCOUNTER — Encounter: Payer: Self-pay | Admitting: Gastroenterology

## 2021-05-30 VITALS — BP 132/88 | HR 70 | Ht 67.0 in | Wt 217.0 lb

## 2021-05-30 DIAGNOSIS — K219 Gastro-esophageal reflux disease without esophagitis: Secondary | ICD-10-CM | POA: Diagnosis not present

## 2021-05-30 DIAGNOSIS — Z86010 Personal history of colonic polyps: Secondary | ICD-10-CM | POA: Diagnosis not present

## 2021-05-30 DIAGNOSIS — R1310 Dysphagia, unspecified: Secondary | ICD-10-CM

## 2021-05-30 DIAGNOSIS — Z860101 Personal history of adenomatous and serrated colon polyps: Secondary | ICD-10-CM

## 2021-05-30 MED ORDER — PANTOPRAZOLE SODIUM 40 MG PO TBEC: DELAYED_RELEASE_TABLET | ORAL | 11 refills | Status: DC

## 2021-05-30 NOTE — Patient Instructions (Signed)
We have sent the following medications to your pharmacy for you to pick up at your convenience: pantoprazole.   Patient advised to avoid spicy, acidic, citrus, chocolate, mints, fruit and fruit juices.  Limit the intake of caffeine, alcohol and Soda.  Don't exercise too soon after eating.  Don't lie down within 3-4 hours of eating.  Elevate the head of your bed.  You have been scheduled for an endoscopy. Please follow written instructions given to you at your visit today. If you use inhalers (even only as needed), please bring them with you on the day of your procedure.  The [facility] providers would like to encourage you to use [facility] to communicate with providers for non-urgent requests or questions.  Due to long hold times on the telephone, sending your provider a message by [facility] may be a faster and more efficient way to get a response.  Please allow 48 business hours for a response.  Please remember that this is for non-urgent requests.   Due to recent changes in healthcare laws, you may see the results of your imaging and laboratory studies on MyChart before your provider has had a chance to review them.  We understand that in some cases there may be results that are confusing or concerning to you. Not all laboratory results come back in the same time frame and the provider may be waiting for multiple results in order to interpret others.  Please give [place] 48 hours in order for your provider to thoroughly review all the results before contacting the office for clarification of your results.   Thank you for choosing me and [facility].  Pricilla Riffle. Dagoberto Ligas., MD., [facility] Reg[facility]

## 2021-05-30 NOTE — Progress Notes (Signed)
° ° °  History of Present Illness: This is a 66 year old female with GERD and dysphagia.  She relates intermittent reflux symptoms for 2 to 3 years.  She has taken over-the-counter Prilosec with reasonable control of her symptoms.  She also relates intermittent solid food dysphagia for 2 years.  She had worsening chest pain and was evaluated and [facility].  I do not have records available to review however the patient relates that it was ultimately determined symptoms are related to GERD.  She was placed on pantoprazole with complete control of reflux symptoms however her intermittent solid food dysphagia has persisted.  Current Medications, Allergies, Past Medical History, Past Surgical History, Family History and Social History were reviewed in [employer] record.   Physical Exam: General: Well developed, well nourished, no acute distress Head: Normocephalic and atraumatic Eyes: Sclerae anicteric, EOMI Ears: Normal auditory acuity Mouth: Not examined, mask on during Covid-19 pandemic Lungs: Clear throughout to auscultation Heart: Regular rate and rhythm; no murmurs, rubs or bruits Abdomen: Soft, non tender and non distended. No masses, hepatosplenomegaly or hernias noted. Normal Bowel sounds Rectal: Not done Musculoskeletal: Symmetrical with no gross deformities  Pulses:  Normal pulses noted Extremities: No clubbing, cyanosis, edema or deformities noted Neurological: Alert oriented x 4, grossly nonfocal Psychological:  Alert and cooperative. Normal mood and affect   Assessment and Recommendations:  GERD and intermittent solid food dysphagia. Follow antireflux measures and continue pantoprazole 40 mg po qd. Schedule EGD with possible dilation. The risks (including bleeding, perforation, infection, missed lesions, medication reactions and possible hospitalization or surgery if complications occur), benefits, and alternatives to endoscopy with possible biopsy  and possible dilation were discussed with the patient and they consent to proceed.   Personal history of adenomatous colon polyps.  Surveillance colonoscopy recommended in May 2027.

## 2021-06-27 ENCOUNTER — Ambulatory Visit (AMBULATORY_SURGERY_CENTER): Payer: Medicare Other | Admitting: Gastroenterology

## 2021-06-27 ENCOUNTER — Other Ambulatory Visit: Payer: Self-pay

## 2021-06-27 ENCOUNTER — Encounter: Payer: Self-pay | Admitting: Gastroenterology

## 2021-06-27 VITALS — BP 106/70 | HR 55 | Temp 97.5°F | Resp 13 | Ht 67.0 in | Wt 217.0 lb

## 2021-06-27 DIAGNOSIS — K222 Esophageal obstruction: Secondary | ICD-10-CM

## 2021-06-27 DIAGNOSIS — K2100 Gastro-esophageal reflux disease with esophagitis, without bleeding: Secondary | ICD-10-CM

## 2021-06-27 DIAGNOSIS — R1310 Dysphagia, unspecified: Secondary | ICD-10-CM

## 2021-06-27 DIAGNOSIS — K219 Gastro-esophageal reflux disease without esophagitis: Secondary | ICD-10-CM

## 2021-06-27 DIAGNOSIS — K449 Diaphragmatic hernia without obstruction or gangrene: Secondary | ICD-10-CM

## 2021-06-27 HISTORY — PX: UPPER GI ENDOSCOPY: SHX6162

## 2021-06-27 MED ORDER — SODIUM CHLORIDE 0.9 % IV SOLN: 500.0000 mL | Freq: Once | INTRAVENOUS | Status: DC

## 2021-06-27 NOTE — Op Note (Signed)
[place] Patient Name: [name] Procedure Date: 06/27/2021 9:39 AM MRN: [id number] Endoscopist: Ladene Artist , MD Age: 66 Referring MD:  Date of Birth: 26-Aug-1955 Gender: Female Account #: [id number] Procedure:                Upper GI endoscopy Indications:              Dysphagia, Gastroesophageal reflux disease Medicines:                Monitored Anesthesia Care Procedure:                Pre-Anesthesia Assessment:                           - Prior to the procedure, a History and Physical                            was performed, and patient medications and                            allergies were reviewed. The patient's tolerance of                            previous anesthesia was also reviewed. The risks                            and benefits of the procedure and the sedation                            options and risks were discussed with the patient.                            All questions were answered, and informed consent                            was obtained. Prior Anticoagulants: The patient has                            taken no previous anticoagulant or antiplatelet                            agents. ASA Grade Assessment: II - A patient with                            mild systemic disease. After reviewing the risks                            and benefits, the patient was deemed in                            satisfactory condition to undergo the procedure.                           After obtaining informed consent, the endoscope was  passed under direct vision. Throughout the                            procedure, the patient's blood pressure, pulse, and                            oxygen saturations were monitored continuously. The                            Endoscope was introduced through the mouth, and                            advanced to the second part of duodenum. The upper                            GI  endoscopy was accomplished without difficulty.                            The patient tolerated the procedure well. Scope In: Scope Out: Findings:                 LA Grade A (one or more mucosal breaks less than 5                            mm, not extending between tops of 2 mucosal folds)                            esophagitis with no bleeding was found at the                            gastroesophageal junction.                           One benign-appearing, intrinsic mild stenosis was                            found 35 cm from the incisors. This stenosis                            measured 1.3 cm (inner diameter) x less than one cm                            (in length). The stenosis was traversed. A                            guidewire was placed and the scope was withdrawn.                            Dilations were performed with Savary dilators with                            mild resistance at 15 mm, 16 mm and 17 mm. No heme  noted.                           The exam of the esophagus was otherwise normal.                           A medium-sized hiatal hernia was present.                           The exam of the stomach was otherwise normal.                           The duodenal bulb and second portion of the                            duodenum were normal. Complications:            No immediate complications. Estimated Blood Loss:     Estimated blood loss: none. Impression:               - LA Grade A reflux esophagitis with no bleeding.                           - Benign-appearing esophageal stenosis. Dilated.                           - Medium-sized hiatal hernia.                           - Normal duodenal bulb and second portion of the                            duodenum.                           - No specimens collected. Recommendation:           - Patient has a contact number available for                            emergencies. The signs and  symptoms of potential                            delayed complications were discussed with the                            patient. Return to normal activities tomorrow.                            Written discharge instructions were provided to the                            patient.                           - Clear liquid diet fro 2 hours, then advance as  tolerated to soft diet today.                           - Resume prior diet tomorrow.                           - Follow antireflux measures long term.                           - Continue present medications.                           - Return to GI office in 2 months. Ladene Artist, MD 06/27/2021 9:58:36 AM This report has been signed electronically.

## 2021-06-27 NOTE — Progress Notes (Signed)
VSS, transported to PACU °

## 2021-06-27 NOTE — Progress Notes (Signed)
Called to room to assist during endoscopic procedure.  Patient ID and intended procedure confirmed with present staff. Received instructions for my participation in the procedure from the performing physician.  

## 2021-06-27 NOTE — Patient Instructions (Addendum)
YOU HAD AN ENDOSCOPIC PROCEDURE TODAY AT [facility]:   Refer to the procedure report that was given to you for any specific questions about what was found during the examination.  If the procedure report does not answer your questions, please call your gastroenterologist to clarify.  If you requested that your care partner not be given the details of your procedure findings, then the procedure report has been included in a sealed envelope for you to review at your convenience later.  YOU SHOULD EXPECT: Some feelings of bloating in the abdomen. Passage of more gas than usual.  Walking can help get rid of the air that was put into your GI tract during the procedure and reduce the bloating. If you had a lower endoscopy (such as a colonoscopy or flexible sigmoidoscopy) you may notice spotting of blood in your stool or on the toilet paper. If you underwent a bowel prep for your procedure, you may not have a normal bowel movement for a few days.  Please Note:  You might notice some irritation and congestion in your nose or some drainage.  This is from the oxygen used during your procedure.  There is no need for concern and it should clear up in a day or so.  SYMPTOMS TO REPORT IMMEDIATELY:  Following upper endoscopy (EGD)  Vomiting of blood or coffee ground material  New chest pain or pain under the shoulder blades  Painful or persistently difficult swallowing  New shortness of breath  Fever of 100F or higher  Black, tarry-looking stools  For urgent or emergent issues, a gastroenterologist can be reached at any hour by calling [phone]. Do not use MyChart messaging for urgent concerns.    DIET: Dilation diet today, but then you may proceed to your regular diet tomorrow.  Drink plenty of fluids but you should avoid alcoholic beverages for 24 hours.  ACTIVITY:  You should plan to take it easy for the rest of today and you should NOT DRIVE or use heavy machinery until tomorrow  (because of the sedation medicines used during the test).    FOLLOW UP: Our staff will call the number listed on your records 48-72 hours following your procedure to check on you and address any questions or concerns that you may have regarding the information given to you following your procedure. If we do not reach you, we will leave a message.  We will attempt to reach you two times.  During this call, we will ask if you have developed any symptoms of COVID 19. If you develop any symptoms (ie: fever, flu-like symptoms, shortness of breath, cough etc.) before then, please call [phone].  If you test positive for Covid 19 in the 2 weeks post procedure, please call and report this information to [place].     SIGNATURES/CONFIDENTIALITY: You and/or your care partner have signed paperwork which will be entered into your electronic medical record.  These signatures attest to the fact that that the information above on your After Visit Summary has been reviewed and is understood.  Full responsibility of the confidentiality of this discharge information lies with you and/or your care-partner.

## 2021-06-27 NOTE — Progress Notes (Signed)
See 05/30/2021 H&P,  no changes.

## 2021-06-29 ENCOUNTER — Telehealth: Payer: Self-pay | Admitting: *Deleted

## 2021-06-29 NOTE — Telephone Encounter (Signed)
°  Follow up Call-  Call back number 06/27/2021 09/22/2020  Post procedure Call Back phone  # [phone]  Permission to leave phone message Yes Yes  Some recent data might be hidden     Patient questions:  Do you have a fever, pain , or abdominal swelling? No. Pain Score  0 *  Have you tolerated food without any problems? Yes.    Have you been able to return to your normal activities? Yes.    Do you have any questions about your discharge instructions: Diet   No. Medications  No. Follow up visit  No.  Do you have questions or concerns about your Care? No.  Actions: * If pain score is 4 or above: No action needed, pain <4.

## 2021-07-12 ENCOUNTER — Telehealth: Payer: Self-pay

## 2021-07-12 NOTE — Telephone Encounter (Signed)
Patient has been scheduled for her 2 month follow up with Dr. Fuller Plan on 09/06/21 at 8:30 am. Patient is aware.  ?

## 2021-09-06 ENCOUNTER — Encounter: Payer: Self-pay | Admitting: Gastroenterology

## 2021-09-06 ENCOUNTER — Ambulatory Visit (INDEPENDENT_AMBULATORY_CARE_PROVIDER_SITE_OTHER): Payer: Medicare Other | Admitting: Gastroenterology

## 2021-09-06 VITALS — BP 118/60 | HR 60 | Ht 67.0 in | Wt 214.0 lb

## 2021-09-06 DIAGNOSIS — K222 Esophageal obstruction: Secondary | ICD-10-CM

## 2021-09-06 DIAGNOSIS — K2100 Gastro-esophageal reflux disease with esophagitis, without bleeding: Secondary | ICD-10-CM

## 2021-09-06 NOTE — Patient Instructions (Signed)
Continue to follow anti-reflux measures below: ? ?Patient advised to avoid spicy, acidic, citrus, chocolate, mints, fruit and fruit juices.  Limit the intake of caffeine, alcohol and Soda.  Don't exercise too soon after eating.  Don't lie down within 3-4 hours of eating.  Elevate the head of your bed. ? ?Take your pantoprazole daily. ? ?The [facility] providers would like to encourage you to use [facility] to communicate with providers for non-urgent requests or questions.  Due to long hold times on the telephone, sending your provider a message by [facility] may be a faster and more efficient way to get a response.  Please allow 48 business hours for a response.  Please remember that this is for non-urgent requests.  ? ?Thank you for choosing me and [facility]. ? ?Malcolm T. Dagoberto Ligas., MD., [facility] ? ?

## 2021-09-06 NOTE — Progress Notes (Signed)
? ? ?  Assessment   ?  ?GERD with LA class A esophagitis and an esophageal stricture. Dysphagia resolved post dilation.  ?Personal history of adenomatous colon polyps.  ? ?Recommendations  ?  ?Follow antireflux measures long-term ?Continue pantoprazole 40 mg po qd (not as needed).  ?Surveillance colonoscopy recommended in May 2027. ?REV in 1 year. ? ? ?HPI  ?  ?This is a 66 year old female with GERD LA class A esophagitis, esophageal stricture.  She underwent EGD with dilation in February.  Her dysphagia has completely resolved.  She notes intermittent heartburn and is taking pantoprazole on an as-needed basis.  She is closely watching her diet for trigger foods. ? ? ?Labs / Imaging  ?  ?   ? View : No data to display.  ?  ?  ?  ? ? ?   ? View : No data to display.  ?  ?  ?  ? ?EGD 06/2021 ?- LA Grade A reflux esophagitis with no bleeding. ?- Benign-appearing esophageal stenosis. Dilated. ?- Medium-sized hiatal hernia. ?- Normal duodenal bulb and second portion of the duodenum. ?- No specimens collected ? ? ?Current Medications, Allergies, Past Medical History, Past Surgical History, Family History and Social History were reviewed in [employer] record. ? ? ?Physical Exam: ?General: Well developed, well nourished, no acute distress ?Head: Normocephalic and atraumatic ?Eyes: Sclerae anicteric, EOMI ?Ears: Normal auditory acuity ?Mouth: Not examined, mask on during Covid-19 pandemic ?Lungs: Clear throughout to auscultation ?Heart: Regular rate and rhythm; no murmurs, rubs or bruits ?Abdomen: Soft, non tender and non distended. No masses, hepatosplenomegaly or hernias noted. Normal Bowel sounds ?Rectal: Not done ?Musculoskeletal: Symmetrical with no gross deformities  ?Pulses:  Normal pulses noted ?Extremities: No clubbing, cyanosis, edema or deformities noted ?Neurological: Alert oriented x 4, grossly nonfocal ?Psychological:  Alert and cooperative. Normal mood and affect ? ? ?Pricilla Riffle. Fuller Plan, MD  09/06/2021, 8:33 AM  ?

## 2021-09-20 ENCOUNTER — Other Ambulatory Visit: Payer: Self-pay | Admitting: Internal Medicine

## 2021-09-20 DIAGNOSIS — Z1231 Encounter for screening mammogram for malignant neoplasm of breast: Secondary | ICD-10-CM

## 2021-09-28 ENCOUNTER — Telehealth: Payer: Self-pay | Admitting: Gastroenterology

## 2021-09-28 MED ORDER — FAMOTIDINE 40 MG PO TABS: 40.0000 mg | ORAL_TABLET | Freq: Every day | ORAL | 11 refills | Status: DC

## 2021-09-28 NOTE — Telephone Encounter (Signed)
Patient called inquiring if she can be prescribed a different medication for her acid reflux. Patient states previous medication prescribed to her is '' taring up her stomach', also causing shaking in her hands and also blurred vision. Patient states she has stopped all medication at the  moment. Please give patient a call back to advise.  Thank You.

## 2021-09-28 NOTE — Telephone Encounter (Signed)
Patient states she started taking pantoprazole back in January but only as needed. Then after her last visit with Dr. Fuller Plan she resumed taking pantoprazole every day. Patient reports since taking pantoprazole daily, she has had uncontrollable loose stools, blurred vision and "shaky feeling". She has stopped taking pantoprazole and wants to know what alternative she can take. She has also tried omeprazole that gave her similar side effects. Please advise Dr. Fuller Plan.

## 2021-09-28 NOTE — Telephone Encounter (Signed)
Informed patient I am going to send famotidine to her pharmacy. Patient verbalized understanding.

## 2021-09-28 NOTE — Telephone Encounter (Signed)
Famotidine 40 mg po qd, 1 year of refills

## 2021-10-13 ENCOUNTER — Ambulatory Visit
Admission: RE | Admit: 2021-10-13 | Discharge: 2021-10-13 | Disposition: A | Discharge status: Home / Self Care | Payer: Medicare Other | Source: Ambulatory Visit | Attending: Internal Medicine | Admitting: Internal Medicine

## 2021-10-13 DIAGNOSIS — Z1231 Encounter for screening mammogram for malignant neoplasm of breast: Secondary | ICD-10-CM

## 2021-10-20 ENCOUNTER — Emergency Department (HOSPITAL_BASED_OUTPATIENT_CLINIC_OR_DEPARTMENT_OTHER)
Admission: EM | Admit: 2021-10-20 | Discharge: 2021-10-20 | Disposition: A | Discharge status: Home / Self Care | Payer: Medicare Other | Attending: Emergency Medicine | Admitting: Emergency Medicine

## 2021-10-20 ENCOUNTER — Emergency Department (HOSPITAL_BASED_OUTPATIENT_CLINIC_OR_DEPARTMENT_OTHER): Payer: Medicare Other

## 2021-10-20 ENCOUNTER — Other Ambulatory Visit: Payer: Self-pay

## 2021-10-20 ENCOUNTER — Emergency Department (HOSPITAL_BASED_OUTPATIENT_CLINIC_OR_DEPARTMENT_OTHER): Payer: Medicare Other | Admitting: Radiology

## 2021-10-20 ENCOUNTER — Encounter (HOSPITAL_BASED_OUTPATIENT_CLINIC_OR_DEPARTMENT_OTHER): Payer: Self-pay

## 2021-10-20 DIAGNOSIS — R5383 Other fatigue: Secondary | ICD-10-CM | POA: Diagnosis present

## 2021-10-20 DIAGNOSIS — Z20822 Contact with and (suspected) exposure to covid-19: Secondary | ICD-10-CM | POA: Diagnosis not present

## 2021-10-20 DIAGNOSIS — R059 Cough, unspecified: Secondary | ICD-10-CM | POA: Insufficient documentation

## 2021-10-20 DIAGNOSIS — R519 Headache, unspecified: Secondary | ICD-10-CM | POA: Diagnosis not present

## 2021-10-20 DIAGNOSIS — M791 Myalgia, unspecified site: Secondary | ICD-10-CM

## 2021-10-20 LAB — CBC WITH DIFFERENTIAL/PLATELET
Abs Immature Granulocytes: 0.02 10*3/uL (ref 0.00–0.07)
Basophils Absolute: 0 10*3/uL (ref 0.0–0.1)
Basophils Relative: 1 %
Eosinophils Absolute: 0 10*3/uL (ref 0.0–0.5)
Eosinophils Relative: 0 %
HCT: 45.8 % (ref 36.0–46.0)
Hemoglobin: 15.2 g/dL — ABNORMAL HIGH (ref 12.0–15.0)
Immature Granulocytes: 0 %
Lymphocytes Relative: 25 %
Lymphs Abs: 1.5 10*3/uL (ref 0.7–4.0)
MCH: 28.6 pg (ref 26.0–34.0)
MCHC: 33.2 g/dL (ref 30.0–36.0)
MCV: 86.3 fL (ref 80.0–100.0)
Monocytes Absolute: 0.4 10*3/uL (ref 0.1–1.0)
Monocytes Relative: 7 %
Neutro Abs: 4 10*3/uL (ref 1.7–7.7)
Neutrophils Relative %: 67 %
Platelets: 273 10*3/uL (ref 150–400)
RBC: 5.31 MIL/uL — ABNORMAL HIGH (ref 3.87–5.11)
RDW: 13.6 % (ref 11.5–15.5)
WBC: 6 10*3/uL (ref 4.0–10.5)
nRBC: 0 % (ref 0.0–0.2)

## 2021-10-20 LAB — RESP PANEL BY RT-PCR (FLU A&B, COVID) ARPGX2
Influenza A by PCR: NEGATIVE
Influenza B by PCR: NEGATIVE
SARS Coronavirus 2 by RT PCR: NEGATIVE

## 2021-10-20 LAB — BASIC METABOLIC PANEL
Anion gap: 10 (ref 5–15)
BUN: 14 mg/dL (ref 8–23)
CO2: 25 mmol/L (ref 22–32)
Calcium: 10.5 mg/dL — ABNORMAL HIGH (ref 8.9–10.3)
Chloride: 100 mmol/L (ref 98–111)
Creatinine, Ser: 0.78 mg/dL (ref 0.44–1.00)
GFR, Estimated: >60 mL/min (ref >60)
Glucose, Bld: 92 mg/dL (ref 70–99)
Potassium: 4.4 mmol/L (ref 3.5–5.1)
Sodium: 135 mmol/L (ref 135–145)

## 2021-10-20 LAB — GROUP A STREP BY PCR: Group A Strep by PCR: NOT DETECTED

## 2021-10-20 MED ORDER — SODIUM CHLORIDE 0.9 % IV BOLUS
1000.0000 mL | Freq: Once | INTRAVENOUS | Status: AC
  Administered 2021-10-20: 1000 mL via INTRAVENOUS

## 2021-10-20 MED ORDER — KETOROLAC TROMETHAMINE 15 MG/ML IJ SOLN
15.0000 mg | Freq: Once | INTRAMUSCULAR | Status: AC
  Administered 2021-10-20: 15 mg via INTRAVENOUS
  Filled 2021-10-20: qty 1

## 2021-10-20 NOTE — ED Provider Notes (Addendum)
[facility] EMERGENCY DEPT Provider Note   CSN: [id number] Arrival date & time: 10/20/21  1303     History  Chief Complaint  Patient presents with   Generalized Body Aches    [name] is a 66 y.o. female.  Presents emerged part due to concern for body aches, general fatigue.  Patient states that she has been feeling generally unwell for a little over a week.  States that this morning she did note her temperature was up to 100.0 F.  She otherwise has not had any sort of fever.  Some occasional cough but no significant congestion.  No chest pain or shortness of breath.  Has had moderate intermittent headache.  Headache was not sudden onset but she does not normally get headaches. HPI     Home Medications Prior to Admission medications   Medication Sig Start Date End Date Taking? Authorizing Provider  Acetaminophen (TYLENOL PO) Take by mouth.    [provider]  famotidine (PEPCID) 40 MG tablet Take 1 tablet (40 mg total) by mouth daily. 09/28/21   Meryl Dare, MD  fluticasone (FLONASE) 50 MCG/ACT nasal spray fluticasone propionate 50 mcg/actuation nasal spray,suspension    [provider]  guaiFENesin (MUCINEX PO) Take by mouth.    [provider]  Multiple Vitamin (MULTI-VITAMIN PO) Take by mouth.    [provider]  rosuvastatin (CRESTOR) 20 MG tablet Take by mouth. 03/01/21   [provider]      Allergies    Pantoprazole    Review of Systems   Review of Systems  Constitutional:  Positive for fatigue. Negative for chills and fever.  HENT:  Negative for ear pain and sore throat.   Eyes:  Negative for pain and visual disturbance.  Respiratory:  Positive for cough. Negative for shortness of breath.   Cardiovascular:  Negative for chest pain and palpitations.  Gastrointestinal:  Negative for abdominal pain and vomiting.  Genitourinary:  Negative for dysuria and hematuria.  Musculoskeletal:  Positive for  arthralgias and myalgias. Negative for back pain.  Skin:  Negative for color change and rash.  Neurological:  Positive for headaches. Negative for seizures and syncope.  All other systems reviewed and are negative.   Physical Exam Updated Vital Signs BP (!) 167/91   Pulse 88   Temp 98.6 F (37 C)   Resp 17   Ht 5\' 7"  (1.702 m)   Wt 97.1 kg   SpO2 100%   BMI 33.53 kg/m  Physical Exam Vitals and nursing note reviewed.  Constitutional:      General: She is not in acute distress.    Appearance: She is well-developed.  HENT:     Head: Normocephalic and atraumatic.  Eyes:     Conjunctiva/sclera: Conjunctivae normal.  Cardiovascular:     Rate and Rhythm: Normal rate and regular rhythm.     Heart sounds: No murmur heard. Pulmonary:     Effort: Pulmonary effort is normal. No respiratory distress.     Breath sounds: Normal breath sounds.  Abdominal:     Palpations: Abdomen is soft.     Tenderness: There is no abdominal tenderness.  Musculoskeletal:        General: No swelling.     Cervical back: Neck supple.  Skin:    General: Skin is warm and dry.     Capillary Refill: Capillary refill takes less than 2 seconds.  Neurological:     Mental Status: She is alert.  Psychiatric:  Mood and Affect: Mood normal.     ED Results / Procedures / Treatments   Labs (all labs ordered are listed, but only abnormal results are displayed) Labs Reviewed  CBC WITH DIFFERENTIAL/PLATELET - Abnormal; Notable for the following components:      Result Value   RBC 5.31 (*)    Hemoglobin 15.2 (*)    All other components within normal limits  BASIC METABOLIC PANEL - Abnormal; Notable for the following components:   Calcium 10.5 (*)    All other components within normal limits  GROUP A STREP BY PCR  RESP PANEL BY RT-PCR (FLU A&B, COVID) [id number]    EKG None  Radiology CT Head Wo Contrast  Result Date: 10/20/2021 CLINICAL DATA:  Headache, new or worsening. Headache and body aches  for 1 week. EXAM: CT HEAD WITHOUT CONTRAST TECHNIQUE: Contiguous axial images were obtained from the base of the skull through the vertex without intravenous contrast. RADIATION DOSE REDUCTION: This exam was performed according to the departmental dose-optimization program which includes automated exposure control, adjustment of the mA and/or kV according to patient size and/or use of iterative reconstruction technique. COMPARISON:  None Available. FINDINGS: Brain: No acute infarct, hemorrhage, or mass lesion is present. No significant white matter lesions are present. The ventricles are of normal size. No significant extraaxial fluid collection is present. Vascular: Atherosclerotic calcifications are present within the cavernous internal carotid arteries. No hyperdense vessel is present. Skull: Calvarium is intact. No focal lytic or blastic lesions are present. No significant extracranial soft tissue lesion is present. Sinuses/Orbits: The paranasal sinuses and mastoid air cells are clear. The globes and orbits are within normal limits. IMPRESSION: Negative CT of the head. Electronically Signed   By: Marin Roberts M.D.   On: 10/20/2021 18:01   DG Chest 2 View  Result Date: 10/20/2021 CLINICAL DATA:  Cough. EXAM: CHEST - 2 VIEW COMPARISON:  Chest radiograph March 10, 2021. FINDINGS: No consolidation. No visible pleural effusions or pneumothorax. Cardiomediastinal silhouette is within normal limits. No evidence of acute cardiopulmonary disease. No displaced fracture. IMPRESSION: No active cardiopulmonary disease. Electronically Signed   By: Feliberto Harts M.D.   On: 10/20/2021 16:03    Procedures Procedures    Medications Ordered in ED Medications  sodium chloride 0.9 % bolus 1,000 mL (1,000 mLs Intravenous New Bag/Given 10/20/21 1704)  ketorolac (TORADOL) 15 MG/ML injection 15 mg (15 mg Intravenous Given 10/20/21 1702)    ED Course/ Medical Decision Making/ A&P                            Medical Decision Making Amount and/or Complexity of Data Reviewed Labs: ordered. Radiology: ordered.  Risk Prescription drug management.   66 year old presenting for myalgias, fatigue, cough, headache.  On exam she appears well, no distress, grossly stable vitals, mild hypertension.  She has no anemia, no electrolyte derangement.  Her CXR was independently reviewed and interpreted by myself, no focal infiltrate to suggest pneumonia.  Due to her new headache, check CT, no acute intracranial findings noted.  Provided patient some fluids, ketorolac.  Feeling better, still has normal vitals and remains well-appearing.  Given the work-up and her current clinical appearance feel she can be discharged managed in outpatient setting.  Suspect most likely viral syndrome.  Advised recheck with PCP, return for worsening symptoms.  After the discussed management above, the patient was determined to be safe for discharge.  The patient was in agreement with  this plan and all questions regarding their care were answered.  ED return precautions were discussed and the patient will return to the ED with any significant worsening of condition.         Final Clinical Impression(s) / ED Diagnoses Final diagnoses:  None    Rx / DC Orders ED Discharge Orders     None         Milagros Loll, MD 10/21/21 2128    Milagros Loll, MD 10/21/21 2128

## 2021-10-20 NOTE — ED Triage Notes (Addendum)
Patient here POV from Home.  Patient endorses Body Aches at Home for approximately 1 Week. Generalized but endorses more Pain localized in Joint Areas. Moderate Cough with Production. No Congestion. Sore Throat. Also endorses an Area of Redness to Mid ABD of Unknown Origin. Patient has concerns for Possible Tick Bite.  Temperature of 100.0 at Home today. No Medications PTA.  NAD Noted during Triage. A&Ox4. GCS 15. Ambulatory.

## 2021-10-20 NOTE — Discharge Instructions (Signed)
Follow-up with your primary care doctor.  Discuss your symptoms from today.  Come back here if you are feeling worse, you develop chest pain, difficulty breathing or other new concerning symptom.

## 2021-10-25 ENCOUNTER — Other Ambulatory Visit: Payer: Self-pay | Admitting: Internal Medicine

## 2021-10-25 DIAGNOSIS — R7989 Other specified abnormal findings of blood chemistry: Secondary | ICD-10-CM

## 2021-10-27 ENCOUNTER — Ambulatory Visit
Admission: RE | Admit: 2021-10-27 | Discharge: 2021-10-27 | Disposition: A | Discharge status: Home / Self Care | Payer: Medicare Other | Source: Ambulatory Visit | Attending: Internal Medicine | Admitting: Internal Medicine

## 2021-10-27 DIAGNOSIS — R7989 Other specified abnormal findings of blood chemistry: Secondary | ICD-10-CM

## 2021-10-30 ENCOUNTER — Ambulatory Visit (HOSPITAL_COMMUNITY)
Admission: RE | Admit: 2021-10-30 | Discharge: 2021-10-30 | Disposition: A | Discharge status: Home / Self Care | Payer: Medicare Other | Source: Ambulatory Visit | Attending: Internal Medicine | Admitting: Internal Medicine

## 2021-10-30 ENCOUNTER — Other Ambulatory Visit: Payer: Self-pay | Admitting: Internal Medicine

## 2021-10-30 ENCOUNTER — Encounter (HOSPITAL_COMMUNITY): Payer: Self-pay | Admitting: Radiology

## 2021-10-30 ENCOUNTER — Other Ambulatory Visit (HOSPITAL_COMMUNITY): Payer: Self-pay | Admitting: Internal Medicine

## 2021-10-30 DIAGNOSIS — R932 Abnormal findings on diagnostic imaging of liver and biliary tract: Secondary | ICD-10-CM | POA: Insufficient documentation

## 2021-10-30 MED ORDER — GADOBUTROL 1 MMOL/ML IV SOLN
9.0000 mL | Freq: Once | INTRAVENOUS | Status: AC | PRN
  Administered 2021-10-30: 9 mL via INTRAVENOUS

## 2021-12-01 ENCOUNTER — Encounter: Payer: Self-pay | Admitting: Gastroenterology

## 2022-01-23 ENCOUNTER — Other Ambulatory Visit: Payer: Self-pay | Admitting: Internal Medicine

## 2022-01-23 DIAGNOSIS — Z1339 Encounter for screening examination for other mental health and behavioral disorders: Secondary | ICD-10-CM

## 2022-02-27 ENCOUNTER — Other Ambulatory Visit (HOSPITAL_COMMUNITY): Payer: Self-pay

## 2022-02-27 ENCOUNTER — Ambulatory Visit (INDEPENDENT_AMBULATORY_CARE_PROVIDER_SITE_OTHER): Payer: Medicare Other | Admitting: Gastroenterology

## 2022-02-27 ENCOUNTER — Encounter: Payer: Self-pay | Admitting: Gastroenterology

## 2022-02-27 ENCOUNTER — Telehealth: Payer: Self-pay

## 2022-02-27 VITALS — BP 134/76 | HR 59 | Ht 67.0 in | Wt 212.5 lb

## 2022-02-27 DIAGNOSIS — Z8719 Personal history of other diseases of the digestive system: Secondary | ICD-10-CM

## 2022-02-27 DIAGNOSIS — K2100 Gastro-esophageal reflux disease with esophagitis, without bleeding: Secondary | ICD-10-CM | POA: Diagnosis not present

## 2022-02-27 MED ORDER — ESOMEPRAZOLE MAGNESIUM 40 MG PO CPDR: 40.0000 mg | DELAYED_RELEASE_CAPSULE | Freq: Every day | ORAL | 11 refills | Status: AC

## 2022-02-27 NOTE — Telephone Encounter (Signed)
Pharmacy Patient Advocate Encounter   Received notification from [facility] that prior authorization for Esomeprazole Magnesium '40MG'$  dr capsules is required/requested.   PA submitted on 02/27/2022 to [facility] Medicare via CoverMyMeds Key [id number] Status is pending

## 2022-02-27 NOTE — Telephone Encounter (Signed)
Patient Advocate Encounter  Prior Authorization for Esomeprazole Magnesium '40MG'$  dr capsules has been approved.    PA# [id number]  Key: [id number]  Effective dates: 02/13/2022 until further notice  Approved quantity: 30 units per 30 day(s). May fill up to a 90 day supply

## 2022-02-27 NOTE — Patient Instructions (Signed)
We have sent the following medications to your pharmacy for you to pick up at your convenience: esomeprazole.   Please call our office if you have any side effects or the medication does not help.   The [facility] providers would like to encourage you to use [facility] to communicate with providers for non-urgent requests or questions.  Due to long hold times on the telephone, sending your provider a message by [facility] may be a faster and more efficient way to get a response.  Please allow 48 business hours for a response.  Please remember that this is for non-urgent requests.   Thank you for choosing me and [facility].  Pricilla Riffle. Dagoberto Ligas., MD., [facility]

## 2022-02-27 NOTE — Progress Notes (Signed)
    Assessment     GERD with LA Class A esophagitis and history of an esophageal stricture Diarrhea side effects from pantoprazole and famotidine Personal history of adenomatous colon polyps   Recommendations    Nexium 40 mg p.o. daily and follow antireflux measures.  She advised to contact us if her symptoms are not controlled or she develops side effects for an alternative medication. REV in 1 year   HPI    This is a 66 year old female returning for follow-up of GERD.  Pantoprazole is effective at controlling her reflux symptoms however she developed significant diarrhea with pantoprazole and with famotidine.  No dysphagia symptoms.  When she discontinues reflux medications her reflux symptoms are very active.  EGD Feb 2023 - LA Grade A reflux esophagitis with no bleeding. - Benign-appearing esophageal stenosis. Dilated to 17 mm with Savary. - Medium-sized hiatal hernia. - Normal duodenal bulb and second portion of the duodenum. - No specimens collected.   Labs / Imaging        No data to display             Latest Ref Rng & Units 10/20/2021    5:04 PM  CBC  WBC 4.0 - 10.5 K/uL 6.0   Hemoglobin 12.0 - 15.0 g/dL 15.2   Hematocrit 36.0 - 46.0 % 45.8   Platelets 150 - 400 K/uL 273     Current Medications, Allergies, Past Medical History, Past Surgical History, Family History and Social History were reviewed in [employer] record.   Physical Exam: General: Well developed, well nourished, no acute distress Head: Normocephalic and atraumatic Eyes: Sclerae anicteric, EOMI Ears: Normal auditory acuity Mouth: Not examined Lungs: Clear throughout to auscultation Heart: Regular rate and rhythm; no murmurs, rubs or bruits Abdomen: Soft, non tender and non distended. No masses, hepatosplenomegaly or hernias noted. Normal Bowel sounds Rectal: Not done Musculoskeletal: Symmetrical with no gross deformities  Pulses:  Normal pulses  noted Extremities: No clubbing, cyanosis, edema or deformities noted Neurological: Alert oriented x 4, grossly nonfocal Psychological:  Alert and cooperative. Normal mood and affect   Tola Meas T. Fuller Plan, MD 02/27/2022, 8:52 AM

## 2022-03-07 ENCOUNTER — Ambulatory Visit
Admission: RE | Admit: 2022-03-07 | Discharge: 2022-03-07 | Disposition: A | Discharge status: Home / Self Care | Payer: No Typology Code available for payment source | Source: Ambulatory Visit | Attending: Internal Medicine | Admitting: Internal Medicine

## 2022-03-07 DIAGNOSIS — Z1339 Encounter for screening examination for other mental health and behavioral disorders: Secondary | ICD-10-CM

## 2022-04-12 ENCOUNTER — Other Ambulatory Visit: Payer: Medicare Other

## 2023-03-04 ENCOUNTER — Encounter (INDEPENDENT_AMBULATORY_CARE_PROVIDER_SITE_OTHER): Payer: Medicare Other | Admitting: Ophthalmology

## 2023-03-04 DIAGNOSIS — H43813 Vitreous degeneration, bilateral: Secondary | ICD-10-CM

## 2023-03-04 DIAGNOSIS — H2513 Age-related nuclear cataract, bilateral: Secondary | ICD-10-CM

## 2023-04-23 ENCOUNTER — Other Ambulatory Visit: Payer: Self-pay | Admitting: Internal Medicine

## 2023-04-23 DIAGNOSIS — Z1231 Encounter for screening mammogram for malignant neoplasm of breast: Secondary | ICD-10-CM

## 2023-05-07 ENCOUNTER — Ambulatory Visit
Admission: RE | Admit: 2023-05-07 | Discharge: 2023-05-07 | Disposition: A | Discharge status: Home / Self Care | Payer: Medicare Other | Source: Ambulatory Visit

## 2023-05-07 DIAGNOSIS — Z1231 Encounter for screening mammogram for malignant neoplasm of breast: Secondary | ICD-10-CM

## 2024-03-31 ENCOUNTER — Other Ambulatory Visit: Payer: Self-pay | Admitting: Internal Medicine

## 2024-03-31 DIAGNOSIS — Z1231 Encounter for screening mammogram for malignant neoplasm of breast: Secondary | ICD-10-CM

## 2024-05-11 ENCOUNTER — Ambulatory Visit
Admission: RE | Admit: 2024-05-11 | Discharge: 2024-05-11 | Disposition: A | Discharge status: Home / Self Care | Source: Ambulatory Visit | Attending: Internal Medicine | Admitting: Internal Medicine

## 2024-05-11 DIAGNOSIS — Z1231 Encounter for screening mammogram for malignant neoplasm of breast: Secondary | ICD-10-CM
